# Patient Record
Sex: Female | Born: 1975 | Race: Black or African American | Hispanic: No | Marital: Married | Smoking: Former smoker
Health system: Southern US, Community
[De-identification: ages and names within clinical notes are randomized; demographics above are authoritative.]

## PROBLEM LIST (undated history)

## (undated) DIAGNOSIS — I10 Essential (primary) hypertension: Secondary | ICD-10-CM

## (undated) DIAGNOSIS — E119 Type 2 diabetes mellitus without complications: Secondary | ICD-10-CM

## (undated) HISTORY — PX: DILATION AND CURETTAGE OF UTERUS: SHX78

## (undated) HISTORY — PX: OTHER SURGICAL HISTORY: SHX169

## (undated) HISTORY — PX: TUBAL LIGATION: SHX77

---

## 2011-08-19 ENCOUNTER — Encounter: Payer: Self-pay | Admitting: *Deleted

## 2011-08-19 DIAGNOSIS — R040 Epistaxis: Secondary | ICD-10-CM | POA: Insufficient documentation

## 2011-08-19 DIAGNOSIS — I10 Essential (primary) hypertension: Secondary | ICD-10-CM | POA: Insufficient documentation

## 2011-08-19 NOTE — ED Notes (Signed)
Pt states her nose began bleeding about 10 pm. Headache earlier, but not now.

## 2011-08-20 ENCOUNTER — Emergency Department (HOSPITAL_BASED_OUTPATIENT_CLINIC_OR_DEPARTMENT_OTHER)
Admission: EM | Admit: 2011-08-20 | Discharge: 2011-08-20 | Disposition: A | Discharge status: Home / Self Care | Payer: Self-pay | Attending: Emergency Medicine | Admitting: Emergency Medicine

## 2011-08-20 DIAGNOSIS — R040 Epistaxis: Secondary | ICD-10-CM

## 2011-08-20 DIAGNOSIS — I10 Essential (primary) hypertension: Secondary | ICD-10-CM

## 2011-08-20 HISTORY — DX: Essential (primary) hypertension: I10

## 2011-08-20 MED ORDER — HYDROCHLOROTHIAZIDE 25 MG PO TABS
25.0000 mg | ORAL_TABLET | Freq: Once | ORAL | Status: AC
  Administered 2011-08-20: 25 mg via ORAL
  Filled 2011-08-20: qty 1

## 2011-08-20 MED ORDER — LISINOPRIL 10 MG PO TABS: 10.0000 mg | ORAL_TABLET | Freq: Every day | ORAL | Status: DC

## 2011-08-20 MED ORDER — HYDROCHLOROTHIAZIDE 25 MG PO TABS: 25.0000 mg | ORAL_TABLET | Freq: Every day | ORAL | Status: DC

## 2011-08-20 NOTE — ED Provider Notes (Signed)
History     CSN: [id number] Arrival date & time: 08/20/2011 12:03 AM  Chief Complaint  Patient presents with  . Epistaxis   HPI Comments: History of poorly controlled HTN  Patient is a 35 y.o. female presenting with nosebleeds. The history is provided by the patient. No language interpreter was used.  Epistaxis  This is a new problem. Episode onset: 2 hours prior to arrival. The problem occurs constantly. The problem has been gradually improving. The problem is associated with an unknown factor. The bleeding has been from the left nare. She has tried applying pressure for the symptoms. The treatment provided moderate relief. Her past medical history does not include bleeding disorder, colds, sinus problems, allergies, nose-picking or frequent nosebleeds. Past medical history comments: sleeps with fan.    Past Medical History  Diagnosis Date  . Hypertension     Past Surgical History  Procedure Date  . Ablasion   . Dilation and curettage of uterus   . Cesarean section   . Tubal ligation     History reviewed. No pertinent family history.  History  Substance Use Topics  . Smoking status: Former Games developer  . Smokeless tobacco: Not on file  . Alcohol Use: Yes    OB History    Grav Para Term Preterm Abortions TAB SAB Ect Mult Living                  Review of Systems  Constitutional: Negative for fever, activity change, appetite change and fatigue.  HENT: Positive for nosebleeds. Negative for congestion, sore throat, rhinorrhea, neck pain, neck stiffness and postnasal drip.   Respiratory: Negative for cough, chest tightness and shortness of breath.   Cardiovascular: Negative for chest pain and palpitations.  Gastrointestinal: Negative for nausea, vomiting, abdominal pain and diarrhea.  Genitourinary: Negative for dysuria, urgency, frequency and flank pain.  Skin: Negative for pallor and rash.  Neurological: Negative for dizziness, weakness, light-headedness, numbness and  headaches.  All other systems reviewed and are negative.    Physical Exam  BP 189/111  Pulse 81  Temp(Src) 98.5 F (36.9 C) (Oral)  Resp 18  Ht 5\' 4"  (1.626 m)  Wt 278 lb (126.1 kg)  BMI 47.72 kg/m2  SpO2 99%  Physical Exam  Nursing note and vitals reviewed. Constitutional: She is oriented to person, place, and time. She appears well-developed and well-nourished. No distress.  HENT:  Head: Normocephalic and atraumatic.  Right Ear: External ear normal.  Left Ear: External ear normal.  Nose: Epistaxis (L nare - mild anterior bleed) is observed.  Mouth/Throat: Oropharynx is clear and moist.  Eyes: Conjunctivae and EOM are normal. Pupils are equal, round, and reactive to light.  Neck: Normal range of motion. Neck supple.  Cardiovascular: Normal rate, regular rhythm, normal heart sounds and intact distal pulses.  Exam reveals no gallop and no friction rub.   No murmur heard. Pulmonary/Chest: Effort normal and breath sounds normal. No respiratory distress.  Abdominal: Soft. Bowel sounds are normal. There is no tenderness.  Musculoskeletal: Normal range of motion.  Neurological: She is alert and oriented to person, place, and time.  Skin: Skin is warm and dry.    ED Course  EPISTAXIS MANAGEMENT Performed by: Dayton Bailiff Authorized by: Dayton Bailiff Consent: Verbal consent obtained. Risks and benefits: risks, benefits and alternatives were discussed Consent given by: patient Patient understanding: patient states understanding of the procedure being performed Patient identity confirmed: verbally with patient Time out: Immediately prior to procedure a "time out"  was called to verify the correct patient, procedure, equipment, support staff and site/side marked as required. Preparation: Patient was prepped and draped in the usual sterile fashion. Post-procedure assessment: bleeding stopped Patient tolerance: Patient tolerated the procedure well with no immediate  complications. Comments: Bleeding controlled with afrin and direct pressure. Bleeding did not resume    MDM Hypertension and epistaxis Epistaxis is likely secondary to both under control hypertension and dryness of her nasal mucosa. Bleeding was stopped with application of Afrin direct pressure. Bleeding did not resume. I administered a dose of hydrochlorothiazide and will prescribe her refills of her lisinopril and a prescription for hydrochlorothiazide. I instructed her to followup with her primary care physician for further blood pressure management. She is provided instructions for saline drops, sinus precautions and instructions bleeding redevelops. Instructions were provided for return to emergency department. Patient is safe for discharge. Bleeding was relatively minimal therefore do not feel the patient warranted a CBC at this time. Blood pressure improved in the emergency department and remainder of VS unremarkable      Dayton Bailiff, MD 08/20/11 [phone]

## 2014-02-14 ENCOUNTER — Emergency Department (HOSPITAL_BASED_OUTPATIENT_CLINIC_OR_DEPARTMENT_OTHER)
Admission: EM | Admit: 2014-02-14 | Discharge: 2014-02-14 | Disposition: A | Discharge status: Home / Self Care | Payer: Medicaid Other | Attending: Emergency Medicine | Admitting: Emergency Medicine

## 2014-02-14 ENCOUNTER — Encounter (HOSPITAL_BASED_OUTPATIENT_CLINIC_OR_DEPARTMENT_OTHER): Payer: Self-pay | Admitting: Emergency Medicine

## 2014-02-14 DIAGNOSIS — Y92410 Unspecified street and highway as the place of occurrence of the external cause: Secondary | ICD-10-CM | POA: Insufficient documentation

## 2014-02-14 DIAGNOSIS — Z79899 Other long term (current) drug therapy: Secondary | ICD-10-CM | POA: Insufficient documentation

## 2014-02-14 DIAGNOSIS — S239XXA Sprain of unspecified parts of thorax, initial encounter: Secondary | ICD-10-CM | POA: Insufficient documentation

## 2014-02-14 DIAGNOSIS — Y9389 Activity, other specified: Secondary | ICD-10-CM | POA: Insufficient documentation

## 2014-02-14 DIAGNOSIS — Z87891 Personal history of nicotine dependence: Secondary | ICD-10-CM | POA: Insufficient documentation

## 2014-02-14 DIAGNOSIS — I10 Essential (primary) hypertension: Secondary | ICD-10-CM | POA: Insufficient documentation

## 2014-02-14 MED ORDER — CYCLOBENZAPRINE HCL 10 MG PO TABS: 10.0000 mg | ORAL_TABLET | Freq: Two times a day (BID) | ORAL | Status: DC | PRN
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
## 2014-02-14 NOTE — ED Notes (Signed)
rx x 1 for flexeril

## 2014-02-14 NOTE — Discharge Instructions (Signed)
Continue to take the ibuprofen for pain and inflammation. Take the Flexeril as needed for muscle spasm. Do not take it if you are driving as it will make you sleepy.

## 2014-02-15 NOTE — ED Provider Notes (Signed)
Medical screening examination/treatment/procedure(s) were performed by non-physician practitioner and as supervising physician I was immediately available for consultation/collaboration.   EKG Interpretation None       Kiffany Schelling, MD 02/15/14 2325 

## 2019-03-28 ENCOUNTER — Other Ambulatory Visit: Payer: Self-pay

## 2019-03-28 ENCOUNTER — Encounter (HOSPITAL_BASED_OUTPATIENT_CLINIC_OR_DEPARTMENT_OTHER): Payer: Self-pay | Admitting: *Deleted

## 2019-03-28 ENCOUNTER — Emergency Department (HOSPITAL_BASED_OUTPATIENT_CLINIC_OR_DEPARTMENT_OTHER)
Admission: EM | Admit: 2019-03-28 | Discharge: 2019-03-28 | Disposition: A | Discharge status: Home / Self Care | Payer: BLUE CROSS/BLUE SHIELD | Attending: Emergency Medicine | Admitting: Emergency Medicine

## 2019-03-28 DIAGNOSIS — Z87891 Personal history of nicotine dependence: Secondary | ICD-10-CM | POA: Insufficient documentation

## 2019-03-28 DIAGNOSIS — K625 Hemorrhage of anus and rectum: Secondary | ICD-10-CM | POA: Insufficient documentation

## 2019-03-28 DIAGNOSIS — Z79899 Other long term (current) drug therapy: Secondary | ICD-10-CM | POA: Diagnosis not present

## 2019-03-28 DIAGNOSIS — I10 Essential (primary) hypertension: Secondary | ICD-10-CM | POA: Diagnosis not present

## 2019-03-28 DIAGNOSIS — R739 Hyperglycemia, unspecified: Secondary | ICD-10-CM

## 2019-03-28 LAB — OCCULT BLOOD X 1 CARD TO LAB, STOOL: Fecal Occult Bld: POSITIVE — AB

## 2019-03-28 LAB — CBC WITH DIFFERENTIAL/PLATELET
Abs Immature Granulocytes: 0.03 10*3/uL (ref 0.00–0.07)
Basophils Absolute: 0 10*3/uL (ref 0.0–0.1)
Basophils Relative: 1 %
Eosinophils Absolute: 0.1 10*3/uL (ref 0.0–0.5)
Eosinophils Relative: 1 %
HCT: 42 % (ref 36.0–46.0)
Hemoglobin: 13.3 g/dL (ref 12.0–15.0)
Immature Granulocytes: 0 %
Lymphocytes Relative: 37 %
Lymphs Abs: 3 10*3/uL (ref 0.7–4.0)
MCH: 27.8 pg (ref 26.0–34.0)
MCHC: 31.7 g/dL (ref 30.0–36.0)
MCV: 87.9 fL (ref 80.0–100.0)
Monocytes Absolute: 0.9 10*3/uL (ref 0.1–1.0)
Monocytes Relative: 11 %
Neutro Abs: 4.1 10*3/uL (ref 1.7–7.7)
Neutrophils Relative %: 50 %
Platelets: 238 10*3/uL (ref 150–400)
RBC: 4.78 MIL/uL (ref 3.87–5.11)
RDW: 13.1 % (ref 11.5–15.5)
WBC: 8.1 10*3/uL (ref 4.0–10.5)
nRBC: 0 % (ref 0.0–0.2)

## 2019-03-28 LAB — COMPREHENSIVE METABOLIC PANEL
ALT: 23 U/L (ref 0–44)
AST: 24 U/L (ref 15–41)
Albumin: 3.7 g/dL (ref 3.5–5.0)
Alkaline Phosphatase: 85 U/L (ref 38–126)
Anion gap: 7 (ref 5–15)
BUN: 9 mg/dL (ref 6–20)
CO2: 25 mmol/L (ref 22–32)
Calcium: 8.9 mg/dL (ref 8.9–10.3)
Chloride: 100 mmol/L (ref 98–111)
Creatinine, Ser: 1.05 mg/dL — ABNORMAL HIGH (ref 0.44–1.00)
GFR calc Af Amer: >60 mL/min (ref >60)
GFR calc non Af Amer: >60 mL/min (ref >60)
Glucose, Bld: 267 mg/dL — ABNORMAL HIGH (ref 70–99)
Potassium: 3.7 mmol/L (ref 3.5–5.1)
Sodium: 132 mmol/L — ABNORMAL LOW (ref 135–145)
Total Bilirubin: 0.5 mg/dL (ref 0.3–1.2)
Total Protein: 8 g/dL (ref 6.5–8.1)

## 2019-03-28 MED ORDER — HYDROCORTISONE (PERIANAL) 2.5 % EX CREA: 1.0000 "application " | TOPICAL_CREAM | Freq: Two times a day (BID) | CUTANEOUS | 0 refills | Status: DC

## 2019-03-28 NOTE — ED Provider Notes (Signed)
[facility] EMERGENCY DEPARTMENT Provider Note   CSN: [id number] Arrival date & time: 03/28/19  1827    History   Chief Complaint Chief Complaint  Patient presents with  . Rectal Bleeding    HPI Jessica Washington is a 43 y.o. female.     The history is provided by the patient and medical records. No language interpreter was used.  Rectal Bleeding  Associated symptoms: no abdominal pain and no vomiting    Jessica Washington is a 43 y.o. female  with a PMH of HTN who presents to the Emergency Department complaining of rectal bleeding today.  Patient states that she has never had this before.  She was in her normal state of health yesterday.  She states that she had a bowel movement this morning which was normal.  She then had another bowel movement this afternoon and reports about a handful of bright red blood was in the toilet.  She noticed some bleeding when wiping as well.  Denies any vaginal bleeding and is not on her menstrual cycle.  She states that she has not had any diarrhea or constipation in the last week, but was a little constipated a week or 2 ago.  She denies any abdominal pain, nausea or vomiting.  She has no history of this and has not ever had a colonoscopy.  She denies any rectal pain.  No lightheadedness, weakness, dizziness.  Past Medical History:  Diagnosis Date  . Hypertension     There are no active problems to display for this patient.   Past Surgical History:  Procedure Laterality Date  . Ablasion    . CESAREAN SECTION    . DILATION AND CURETTAGE OF UTERUS    . TUBAL LIGATION       OB History   No obstetric history on file.      Home Medications    Prior to Admission medications   Medication Sig Start Date End Date Taking? Authorizing Provider  amLODipine-benazepril (LOTREL) 5-10 MG per capsule Take 1 capsule by mouth daily.   Yes [provider]  atorvastatin (LIPITOR) 20 MG tablet Take 20 mg by mouth daily.   Yes [provider]  hydrochlorothiazide 25 MG tablet Take 1 tablet (25 mg total) by mouth daily. 08/20/11 03/28/19 Yes Dayton Bailiff, MD  lisinopril (PRINIVIL,ZESTRIL) 10 MG tablet Take 40 mg by mouth daily.    Yes [provider]  omeprazole (PRILOSEC) 40 MG capsule Take 40 mg by mouth daily.   Yes [provider]  cyclobenzaprine (FLEXERIL) 10 MG tablet Take 1 tablet (10 mg total) by mouth 2 (two) times daily as needed for muscle spasms. 02/14/14   Janne Napoleon, NP  hydrocortisone (ANUSOL-HC) 2.5 % rectal cream Place 1 application rectally 2 (two) times daily. 03/28/19   Ward, Chase Picket, PA-C  lisinopril (PRINIVIL,ZESTRIL) 10 MG tablet Take 1 tablet (10 mg total) by mouth daily. 08/20/11 08/19/12  Dayton Bailiff, MD    Family History No family history on file.  Social History Social History   Tobacco Use  . Smoking status: Former Games developer  . Smokeless tobacco: Never Used  Substance Use Topics  . Alcohol use: Yes  . Drug use: No     Allergies   Patient has no known allergies.   Review of Systems Review of Systems  Gastrointestinal: Positive for anal bleeding, blood in stool and hematochezia. Negative for abdominal pain, constipation, diarrhea, nausea and vomiting.  All other systems reviewed and are negative.  Physical Exam Updated Vital Signs BP (!) 167/98   Pulse 85   Temp 97.9 F (36.6 C) (Oral)   Resp 20   Ht 5\' 4"  (1.626 m)   Wt 109.3 kg   SpO2 99%   BMI 41.37 kg/m   Physical Exam Vitals signs and nursing note reviewed.  Constitutional:      General: She is not in acute distress.    Appearance: She is well-developed.  HENT:     Head: Normocephalic and atraumatic.  Neck:     Musculoskeletal: Neck supple.  Cardiovascular:     Rate and Rhythm: Normal rate and regular rhythm.     Heart sounds: Normal heart sounds. No murmur.  Pulmonary:     Effort: Pulmonary effort is normal. No respiratory distress.     Breath sounds: Normal breath sounds.   Abdominal:     General: There is no distension.     Palpations: Abdomen is soft.     Comments: No abdominal tenderness.   Genitourinary:    Comments: Nontender external hemorrhoid with good fleshy coloring.  No overt bleeding noted.  Light brown stool. Skin:    General: Skin is warm and dry.  Neurological:     Mental Status: She is alert and oriented to person, place, and time.      ED Treatments / Results  Labs (all labs ordered are listed, but only abnormal results are displayed) Labs Reviewed  COMPREHENSIVE METABOLIC PANEL - Abnormal; Notable for the following components:      Result Value   Sodium 132 (*)    Glucose, Bld 267 (*)    Creatinine, Ser 1.05 (*)    All other components within normal limits  OCCULT BLOOD X 1 CARD TO LAB, STOOL - Abnormal; Notable for the following components:   Fecal Occult Bld POSITIVE (*)    All other components within normal limits  CBC WITH DIFFERENTIAL/PLATELET    EKG None  Radiology No results found.  Procedures Procedures (including critical care time)  Medications Ordered in ED Medications - No data to display   Initial Impression / Assessment and Plan / ED Course  I have reviewed the triage vital signs and the nursing notes.  Pertinent labs & imaging results that were available during my care of the patient were reviewed by me and considered in my medical decision making (see chart for details).       Jessica Washington is a 43 y.o. female who presents to ED for bright red blood per rectum earlier today. VSS.  GU exam with external hemorrhoid which is nontender.  Light brown stool.  No overt bleeding noted.  No abdominal tenderness.  Hemoglobin normal.  Bleeding quite possibly could be due to the hemorrhoid.  Will provide Anusol cream and have her follow-up with her primary care doctor.  Glucose was noted to be 267.  She does report eating a 12 count took a negative meal with [place] fries and a pink lemonade from [employer] just  prior to arrival.  Still feels little higher than I would expect it to be.  Discussed dietary changes for the weekend.  She has a primary care doctor whom she reports she can follow-up with quickly.  Encouraged her to call first thing Monday morning to schedule follow-up appointment for further discussion of her elevated glucose levels and further work-up for possible diabetes.  Reasons to return to the emergency department were discussed with the patient at length and all questions were answered.  Patient  discussed with Dr. Silverio LayYao who agrees with treatment plan.    Final Clinical Impressions(s) / ED Diagnoses   Final diagnoses:  Rectal bleeding  Hyperglycemia    ED Discharge Orders         Ordered    hydrocortisone (ANUSOL-HC) 2.5 % rectal cream  2 times daily     03/28/19 2011           Ward, Chase PicketJaime Pilcher, PA-C 03/28/19 2024    Charlynne PanderYao, David Hsienta, MD 03/28/19 2113

## 2019-03-28 NOTE — ED Triage Notes (Signed)
Red rectal bleeding today. No pain. States she only sees the blood when she has a BM.

## 2019-03-28 NOTE — Discharge Instructions (Addendum)
It was my pleasure taking care of you today!   Apply cream to rectum area once or twice a day for the next 3-5 days.   As we discussed, watch what you eat closely.  Avoid sugary foods and carbs (breads, pastas, potatoes, etc.).  Call your primary care doctor Monday morning.  Please let them know that your blood sugar was elevated today.  Your glucose was 267.  Let them know that we would like you to be seen to have further lab work to evaluate for diabetes.  You should also discuss your bleeding and let them know how this is doing at that appointment.  Return to ER for new or worsening symptoms, any additional concerns.

## 2020-09-29 ENCOUNTER — Other Ambulatory Visit: Payer: Self-pay

## 2020-09-29 ENCOUNTER — Emergency Department (HOSPITAL_BASED_OUTPATIENT_CLINIC_OR_DEPARTMENT_OTHER): Payer: BLUE CROSS/BLUE SHIELD

## 2020-09-29 ENCOUNTER — Encounter (HOSPITAL_BASED_OUTPATIENT_CLINIC_OR_DEPARTMENT_OTHER): Payer: Self-pay | Admitting: *Deleted

## 2020-09-29 ENCOUNTER — Inpatient Hospital Stay (HOSPITAL_BASED_OUTPATIENT_CLINIC_OR_DEPARTMENT_OTHER)
Admission: EM | Admit: 2020-09-29 | Discharge: 2020-10-03 | DRG: 177 | Disposition: A | Discharge status: Home / Self Care | Payer: BLUE CROSS/BLUE SHIELD | Attending: Internal Medicine | Admitting: Internal Medicine

## 2020-09-29 DIAGNOSIS — J9600 Acute respiratory failure, unspecified whether with hypoxia or hypercapnia: Secondary | ICD-10-CM | POA: Diagnosis not present

## 2020-09-29 DIAGNOSIS — E1165 Type 2 diabetes mellitus with hyperglycemia: Secondary | ICD-10-CM | POA: Diagnosis present

## 2020-09-29 DIAGNOSIS — Z6841 Body Mass Index (BMI) 40.0 and over, adult: Secondary | ICD-10-CM

## 2020-09-29 DIAGNOSIS — Z8249 Family history of ischemic heart disease and other diseases of the circulatory system: Secondary | ICD-10-CM

## 2020-09-29 DIAGNOSIS — J069 Acute upper respiratory infection, unspecified: Secondary | ICD-10-CM | POA: Insufficient documentation

## 2020-09-29 DIAGNOSIS — J1282 Pneumonia due to coronavirus disease 2019: Secondary | ICD-10-CM | POA: Diagnosis present

## 2020-09-29 DIAGNOSIS — I10 Essential (primary) hypertension: Secondary | ICD-10-CM | POA: Diagnosis present

## 2020-09-29 DIAGNOSIS — Z888 Allergy status to other drugs, medicaments and biological substances status: Secondary | ICD-10-CM | POA: Diagnosis not present

## 2020-09-29 DIAGNOSIS — J9601 Acute respiratory failure with hypoxia: Secondary | ICD-10-CM | POA: Diagnosis present

## 2020-09-29 DIAGNOSIS — T380X5A Adverse effect of glucocorticoids and synthetic analogues, initial encounter: Secondary | ICD-10-CM | POA: Diagnosis present

## 2020-09-29 DIAGNOSIS — Z79899 Other long term (current) drug therapy: Secondary | ICD-10-CM | POA: Diagnosis not present

## 2020-09-29 DIAGNOSIS — E871 Hypo-osmolality and hyponatremia: Secondary | ICD-10-CM | POA: Diagnosis present

## 2020-09-29 DIAGNOSIS — U071 COVID-19: Secondary | ICD-10-CM | POA: Diagnosis present

## 2020-09-29 DIAGNOSIS — Y92239 Unspecified place in hospital as the place of occurrence of the external cause: Secondary | ICD-10-CM | POA: Diagnosis present

## 2020-09-29 DIAGNOSIS — E785 Hyperlipidemia, unspecified: Secondary | ICD-10-CM | POA: Diagnosis present

## 2020-09-29 DIAGNOSIS — Z7984 Long term (current) use of oral hypoglycemic drugs: Secondary | ICD-10-CM | POA: Diagnosis not present

## 2020-09-29 DIAGNOSIS — Z87891 Personal history of nicotine dependence: Secondary | ICD-10-CM | POA: Diagnosis not present

## 2020-09-29 HISTORY — DX: Type 2 diabetes mellitus without complications: E11.9

## 2020-09-29 LAB — LACTIC ACID, PLASMA: Lactic Acid, Venous: 0.9 mmol/L (ref 0.5–1.9)

## 2020-09-29 LAB — CBC WITH DIFFERENTIAL/PLATELET
Abs Immature Granulocytes: 0.02 10*3/uL (ref 0.00–0.07)
Basophils Absolute: 0 10*3/uL (ref 0.0–0.1)
Basophils Relative: 0 %
Eosinophils Absolute: 0 10*3/uL (ref 0.0–0.5)
Eosinophils Relative: 0 %
HCT: 36.5 % (ref 36.0–46.0)
Hemoglobin: 11.9 g/dL — ABNORMAL LOW (ref 12.0–15.0)
Immature Granulocytes: 1 %
Lymphocytes Relative: 39 %
Lymphs Abs: 1.6 10*3/uL (ref 0.7–4.0)
MCH: 27.5 pg (ref 26.0–34.0)
MCHC: 32.6 g/dL (ref 30.0–36.0)
MCV: 84.3 fL (ref 80.0–100.0)
Monocytes Absolute: 0.2 10*3/uL (ref 0.1–1.0)
Monocytes Relative: 5 %
Neutro Abs: 2.3 10*3/uL (ref 1.7–7.7)
Neutrophils Relative %: 55 %
Platelets: 168 10*3/uL (ref 150–400)
RBC: 4.33 MIL/uL (ref 3.87–5.11)
RDW: 13.3 % (ref 11.5–15.5)
WBC: 4.1 10*3/uL (ref 4.0–10.5)
nRBC: 0 % (ref 0.0–0.2)

## 2020-09-29 LAB — RESPIRATORY PANEL BY RT PCR (FLU A&B, COVID)
Influenza A by PCR: NEGATIVE
Influenza B by PCR: NEGATIVE
SARS Coronavirus 2 by RT PCR: POSITIVE — AB

## 2020-09-29 LAB — TRIGLYCERIDES: Triglycerides: 146 mg/dL (ref <150)

## 2020-09-29 LAB — COMPREHENSIVE METABOLIC PANEL
ALT: 33 U/L (ref 0–44)
AST: 60 U/L — ABNORMAL HIGH (ref 15–41)
Albumin: 3.3 g/dL — ABNORMAL LOW (ref 3.5–5.0)
Alkaline Phosphatase: 63 U/L (ref 38–126)
Anion gap: 11 (ref 5–15)
BUN: 11 mg/dL (ref 6–20)
CO2: 22 mmol/L (ref 22–32)
Calcium: 8.3 mg/dL — ABNORMAL LOW (ref 8.9–10.3)
Chloride: 100 mmol/L (ref 98–111)
Creatinine, Ser: 1.02 mg/dL — ABNORMAL HIGH (ref 0.44–1.00)
GFR, Estimated: >60 mL/min (ref >60)
Glucose, Bld: 149 mg/dL — ABNORMAL HIGH (ref 70–99)
Potassium: 3.8 mmol/L (ref 3.5–5.1)
Sodium: 133 mmol/L — ABNORMAL LOW (ref 135–145)
Total Bilirubin: 0.2 mg/dL — ABNORMAL LOW (ref 0.3–1.2)
Total Protein: 7.1 g/dL (ref 6.5–8.1)

## 2020-09-29 LAB — C-REACTIVE PROTEIN: CRP: 6.9 mg/dL — ABNORMAL HIGH (ref <1.0)

## 2020-09-29 LAB — FIBRINOGEN: Fibrinogen: 527 mg/dL — ABNORMAL HIGH (ref 210–475)

## 2020-09-29 LAB — D-DIMER, QUANTITATIVE: D-Dimer, Quant: 1.98 ug/mL-FEU — ABNORMAL HIGH (ref 0.00–0.50)

## 2020-09-29 LAB — FERRITIN: Ferritin: 335 ng/mL — ABNORMAL HIGH (ref 11–307)

## 2020-09-29 LAB — PROCALCITONIN: Procalcitonin: 0.19 ng/mL

## 2020-09-29 LAB — LACTATE DEHYDROGENASE: LDH: 435 U/L — ABNORMAL HIGH (ref 98–192)

## 2020-09-29 MED ORDER — HYDROCHLOROTHIAZIDE 25 MG PO TABS
50.0000 mg | ORAL_TABLET | Freq: Every day | ORAL | Status: DC
  Administered 2020-09-30 – 2020-10-03 (×4): 50 mg via ORAL
  Filled 2020-09-29 (×4): qty 2

## 2020-09-29 MED ORDER — ZINC SULFATE 220 (50 ZN) MG PO CAPS
220.0000 mg | ORAL_CAPSULE | Freq: Every day | ORAL | Status: DC
  Administered 2020-09-30 – 2020-10-03 (×4): 220 mg via ORAL
  Filled 2020-09-29 (×4): qty 1

## 2020-09-29 MED ORDER — AMLODIPINE BESYLATE 10 MG PO TABS
10.0000 mg | ORAL_TABLET | Freq: Every day | ORAL | Status: DC
  Administered 2020-09-30 – 2020-10-03 (×4): 10 mg via ORAL
  Filled 2020-09-29 (×4): qty 1

## 2020-09-29 MED ORDER — ACETAMINOPHEN 650 MG RE SUPP: 650.0000 mg | Freq: Four times a day (QID) | RECTAL | Status: DC | PRN

## 2020-09-29 MED ORDER — METHYLPREDNISOLONE SODIUM SUCC 125 MG IJ SOLR
0.5000 mg/kg | Freq: Two times a day (BID) | INTRAMUSCULAR | Status: DC
  Administered 2020-09-30 (×2): 55 mg via INTRAVENOUS
  Filled 2020-09-29 (×2): qty 2

## 2020-09-29 MED ORDER — ENOXAPARIN SODIUM 40 MG/0.4ML ~~LOC~~ SOLN
40.0000 mg | SUBCUTANEOUS | Status: DC
  Administered 2020-09-30: 40 mg via SUBCUTANEOUS
  Filled 2020-09-29: qty 0.4

## 2020-09-29 MED ORDER — ASCORBIC ACID 500 MG PO TABS
500.0000 mg | ORAL_TABLET | Freq: Every day | ORAL | Status: DC
  Administered 2020-09-30 – 2020-10-03 (×4): 500 mg via ORAL
  Filled 2020-09-29 (×4): qty 1

## 2020-09-29 MED ORDER — LOSARTAN POTASSIUM 50 MG PO TABS
100.0000 mg | ORAL_TABLET | Freq: Every day | ORAL | Status: DC
  Administered 2020-09-30 – 2020-10-03 (×4): 100 mg via ORAL
  Filled 2020-09-29 (×4): qty 2

## 2020-09-29 MED ORDER — GUAIFENESIN-DM 100-10 MG/5ML PO SYRP
10.0000 mL | ORAL_SOLUTION | ORAL | Status: DC | PRN
  Administered 2020-09-30 (×2): 10 mL via ORAL
  Filled 2020-09-29 (×2): qty 10

## 2020-09-29 MED ORDER — SODIUM CHLORIDE 0.9 % IV SOLN
100.0000 mg | Freq: Every day | INTRAVENOUS | Status: AC
  Administered 2020-09-30 – 2020-10-03 (×4): 100 mg via INTRAVENOUS
  Filled 2020-09-29 (×4): qty 20

## 2020-09-29 MED ORDER — ROSUVASTATIN CALCIUM 20 MG PO TABS
40.0000 mg | ORAL_TABLET | Freq: Every day | ORAL | Status: DC
  Administered 2020-09-30 – 2020-10-03 (×4): 40 mg via ORAL
  Filled 2020-09-29 (×4): qty 2

## 2020-09-29 MED ORDER — IOHEXOL 350 MG/ML SOLN
100.0000 mL | Freq: Once | INTRAVENOUS | Status: AC | PRN
  Administered 2020-09-29: 100 mL via INTRAVENOUS

## 2020-09-29 MED ORDER — SODIUM CHLORIDE 0.9 % IV SOLN
100.0000 mg | Freq: Every day | INTRAVENOUS | Status: AC
  Administered 2020-09-29: 100 mg via INTRAVENOUS
  Filled 2020-09-29: qty 20

## 2020-09-29 MED ORDER — INSULIN ASPART 100 UNIT/ML ~~LOC~~ SOLN: 0.0000 [IU] | Freq: Three times a day (TID) | SUBCUTANEOUS | Status: DC

## 2020-09-29 MED ORDER — INSULIN GLARGINE 100 UNIT/ML ~~LOC~~ SOLN
15.0000 [IU] | Freq: Every day | SUBCUTANEOUS | Status: DC
  Administered 2020-09-30 (×2): 15 [IU] via SUBCUTANEOUS
  Filled 2020-09-29 (×2): qty 0.15

## 2020-09-29 MED ORDER — ACETAMINOPHEN 325 MG PO TABS: 650.0000 mg | ORAL_TABLET | Freq: Four times a day (QID) | ORAL | Status: DC | PRN

## 2020-09-29 MED ORDER — DEXAMETHASONE SODIUM PHOSPHATE 10 MG/ML IJ SOLN
10.0000 mg | Freq: Once | INTRAMUSCULAR | Status: AC
  Administered 2020-09-29: 10 mg via INTRAVENOUS
  Filled 2020-09-29: qty 1

## 2020-09-29 MED ORDER — HYDROCOD POLST-CPM POLST ER 10-8 MG/5ML PO SUER
5.0000 mL | Freq: Two times a day (BID) | ORAL | Status: DC | PRN
  Administered 2020-09-30: 5 mL via ORAL
  Filled 2020-09-29: qty 5

## 2020-09-29 MED ORDER — PREDNISONE 20 MG PO TABS: 50.0000 mg | ORAL_TABLET | Freq: Every day | ORAL | Status: DC

## 2020-09-29 MED ORDER — EZETIMIBE 10 MG PO TABS
10.0000 mg | ORAL_TABLET | Freq: Every day | ORAL | Status: DC
  Administered 2020-09-30 – 2020-10-03 (×4): 10 mg via ORAL
  Filled 2020-09-29 (×4): qty 1

## 2020-09-29 MED ORDER — ACETAMINOPHEN 325 MG PO TABS
650.0000 mg | ORAL_TABLET | Freq: Once | ORAL | Status: AC
  Administered 2020-09-29: 650 mg via ORAL
  Filled 2020-09-29: qty 2

## 2020-09-29 MED ORDER — PANTOPRAZOLE SODIUM 40 MG PO TBEC
40.0000 mg | DELAYED_RELEASE_TABLET | Freq: Every day | ORAL | Status: DC
  Administered 2020-09-30 – 2020-10-03 (×4): 40 mg via ORAL
  Filled 2020-09-29 (×4): qty 1

## 2020-09-29 MED ORDER — SODIUM CHLORIDE 0.9 % IV SOLN
INTRAVENOUS | Status: DC | PRN
  Administered 2020-09-29: 500 mL via INTRAVENOUS

## 2020-09-29 NOTE — Treatment Plan (Signed)
44 yo with hx htn with URI symptoms, COVID positive.  She's unvaccinated.  Worsening SOB over past 2 days.  Currently requiring 2 L to maintain O2 sats >88%.  Vitals notable for fever, tachypnea, hypoxia.  Labs notable for elevated d dimer and mildly elevated AST.  Hyperglycemia.  Requested CT PE protocol.  Will admit to tele bed.  Steroids, remdesivir ordered per EDP.

## 2020-09-29 NOTE — ED Triage Notes (Signed)
C/o URI symptoms x 6 days

## 2020-09-29 NOTE — ED Notes (Signed)
Patient transported to CT 

## 2020-09-29 NOTE — ED Provider Notes (Signed)
[facility] EMERGENCY DEPARTMENT Provider Note   CSN: [id number] Arrival date & time: 09/29/20  1409     History Chief Complaint  Patient presents with  . Cough    Jessica Washington is a 44 y.o. female.  HPI Patient is a 44 year old female with a history of diabetes and hypertension on medications for both of these presented today with 6 days of "URI-like symptoms ".  She states that these consist of cough, congestion, shortness of breath.  She states this is significantly worsened over the past 2 days.  She states that she did not know she had a fever until she was told that she had 1.  She denies any lightheadedness or dizziness.  She denies any chest pain.  She denies any unilateral leg swelling or calf tenderness.  She states she has been able to do her activities of daily living but been so fatigued that she feels like she has only been making trips to do basic tasks.  She denies any heart palpitations.  She is unvaccinated for COVID-19.  She states her husband has had similar symptoms.  Denies any nausea vomiting diarrhea or abdominal pain.  She is no aggravating or mitigating factors of her symptoms.  She has not used any significant medications prior to arrival other than her normal prescribed medications.     Past Medical History:  Diagnosis Date  . Hypertension     There are no problems to display for this patient.   Past Surgical History:  Procedure Laterality Date  . Ablasion    . CESAREAN SECTION    . DILATION AND CURETTAGE OF UTERUS    . TUBAL LIGATION       OB History   No obstetric history on file.     No family history on file.  Social History   Tobacco Use  . Smoking status: Former Games developer  . Smokeless tobacco: Never Used  Substance Use Topics  . Alcohol use: Yes  . Drug use: No    Home Medications Prior to Admission medications   Medication Sig Start Date End Date Taking? Authorizing Provider  amLODipine-benazepril (LOTREL) 5-10 MG  per capsule Take 1 capsule by mouth daily.    [provider]  atorvastatin (LIPITOR) 20 MG tablet Take 20 mg by mouth daily.    [provider]  cyclobenzaprine (FLEXERIL) 10 MG tablet Take 1 tablet (10 mg total) by mouth 2 (two) times daily as needed for muscle spasms. 02/14/14   Janne Napoleon, NP  hydrochlorothiazide 25 MG tablet Take 1 tablet (25 mg total) by mouth daily. 08/20/11 03/28/19  Dayton Bailiff, MD  hydrocortisone (ANUSOL-HC) 2.5 % rectal cream Place 1 application rectally 2 (two) times daily. 03/28/19   Ward, Chase Picket, PA-C  lisinopril (PRINIVIL,ZESTRIL) 10 MG tablet Take 40 mg by mouth daily.     [provider]  lisinopril (PRINIVIL,ZESTRIL) 10 MG tablet Take 1 tablet (10 mg total) by mouth daily. 08/20/11 08/19/12  Dayton Bailiff, MD  omeprazole (PRILOSEC) 40 MG capsule Take 40 mg by mouth daily.    [provider]    Allergies    Patient has no known allergies.  Review of Systems   Review of Systems  Constitutional: Positive for chills, fatigue and fever.  HENT: Positive for congestion and rhinorrhea.   Respiratory: Positive for cough and shortness of breath.   Cardiovascular: Negative for chest pain and leg swelling.  Gastrointestinal: Positive for nausea. Negative for abdominal pain, diarrhea and vomiting.  Genitourinary: Negative for dysuria.  Musculoskeletal: Positive for myalgias.  Skin: Negative for rash.  Neurological: Negative for dizziness and headaches.  Psychiatric/Behavioral: Negative for agitation.    Physical Exam Updated Vital Signs BP 137/80   Pulse 88   Temp 99 F (37.2 C) (Oral)   Resp 17   Ht 5\' 4"  (1.626 m)   Wt 110.2 kg   SpO2 94%   BMI 41.71 kg/m   Physical Exam Vitals and nursing note reviewed.  Constitutional:      General: She is not in acute distress.    Comments: Pleasant well-appearing 44 year old.  In no acute distress.  Sitting comfortably in bed.  Able answer questions appropriately follow  commands. No increased work of breathing. Speaking in full sentences.  HENT:     Head: Normocephalic and atraumatic.     Nose: Nose normal.     Mouth/Throat:     Mouth: Mucous membranes are moist.  Eyes:     General: No scleral icterus. Cardiovascular:     Rate and Rhythm: Normal rate and regular rhythm.     Pulses: Normal pulses.     Heart sounds: Normal heart sounds.  Pulmonary:     Effort: Pulmonary effort is normal. No respiratory distress.     Breath sounds: Rales present. No wheezing.  Chest:     Chest wall: No tenderness.  Abdominal:     Palpations: Abdomen is soft.     Tenderness: There is no abdominal tenderness. There is no guarding or rebound.  Musculoskeletal:     Cervical back: Normal range of motion.     Right lower leg: No edema.     Left lower leg: No edema.  Skin:    General: Skin is warm and dry.     Capillary Refill: Capillary refill takes less than 2 seconds.  Neurological:     Mental Status: She is alert. Mental status is at baseline.  Psychiatric:        Mood and Affect: Mood normal.        Behavior: Behavior normal.     ED Results / Procedures / Treatments   Labs (all labs ordered are listed, but only abnormal results are displayed) Labs Reviewed  RESPIRATORY PANEL BY RT PCR (FLU A&B, COVID) - Abnormal; Notable for the following components:      Result Value   SARS Coronavirus 2 by RT PCR POSITIVE (*)    All other components within normal limits  CBC WITH DIFFERENTIAL/PLATELET - Abnormal; Notable for the following components:   Hemoglobin 11.9 (*)    All other components within normal limits  COMPREHENSIVE METABOLIC PANEL - Abnormal; Notable for the following components:   Sodium 133 (*)    Glucose, Bld 149 (*)    Creatinine, Ser 1.02 (*)    Calcium 8.3 (*)    Albumin 3.3 (*)    AST 60 (*)    Total Bilirubin 0.2 (*)    All other components within normal limits  CULTURE, BLOOD (ROUTINE X 2)  CULTURE, BLOOD (ROUTINE X 2)  LACTIC ACID,  PLASMA  LACTIC ACID, PLASMA  LACTATE DEHYDROGENASE  FERRITIN  TRIGLYCERIDES  C-REACTIVE PROTEIN  PREGNANCY, URINE  D-DIMER, QUANTITATIVE (NOT AT [facility])  FIBRINOGEN  PROCALCITONIN    EKG None  Radiology DG Chest Port 1 View  Result Date: 09/29/2020 CLINICAL DATA:  Shortness of breath, suspect COVID. EXAM: PORTABLE CHEST 1 VIEW COMPARISON:  None. FINDINGS: Cardiac silhouette is accentuated by low lung volumes and portable technique. Patchy  airspace opacities in bilateral lung bases and the left midlung. No visible pleural effusions or pneumothorax. Borderline enlargement of cardiac silhouette which is accentuated by low lung volumes and portable technique. The visualized skeletal structures are unremarkable. IMPRESSION: Patchy airspace opacities in bilateral lung bases and the left midlung, compatible with multifocal pneumonia. Correlate with COVID testing. Electronically Signed   By: Feliberto Harts MD   On: 09/29/2020 15:25    Procedures .Critical Care Performed by: Gailen Shelter, PA Authorized by: Gailen Shelter, PA   Critical care provider statement:    Critical care time (minutes):  45   Critical care was necessary to treat or prevent imminent or life-threatening deterioration of the following conditions:  Respiratory failure   Critical care was time spent personally by me on the following activities:  Discussions with consultants, evaluation of patient's response to treatment, examination of patient, ordering and performing treatments and interventions, ordering and review of laboratory studies, ordering and review of radiographic studies, pulse oximetry, re-evaluation of patient's condition, obtaining history from patient or surrogate and review of old charts Comments:     Hypoxic respiratory failure secondary to COVID-19   (including critical care time)  Medications Ordered in ED Medications  dexamethasone (DECADRON) injection 10 mg (has no administration in time  range)  acetaminophen (TYLENOL) tablet 650 mg (650 mg Oral Given 09/29/20 1428)    ED Course  I have reviewed the triage vital signs and the nursing notes.  Pertinent labs & imaging results that were available during my care of the patient were reviewed by me and considered in my medical decision making (see chart for details).    MDM Rules/Calculators/A&P                          Patient is a 44 year old female with past medical history as above presented for 6 days of symptoms consistent with COVID-19. She has not been vaccinated nor she has been tested yet. Physical exam is notable for crackles and she is notably hypoxic desaturating to 87% when I personally ambulated the patient. She is mildly tachypneic but not severely uncomfortable.  Suspect Covid related hypoxia low suspicion for pulmonary embolism given she has few risk factors apart from her obesity.  CMP with very mild hyponatremia kidney function very mildly elevated at 1.02 however no comparison this may be baseline. CBC without leukocytosis or significant/impressive anemia. Lactic within normal limits.  Covid test is positive. Negative for flu. Orders placed for remdesivir and Decadron  Chest x-ray consistent with COVID-19. Agree of radiology read.  Discussed with Dr. Lowell Guitar of internal medicine who will place admission orders for patient for telemetry. He is requesting CT PE study before admission. Will place order for this. Patient updated on plan. Remdesivir and Decadron provided to patient her fever has significantly improved since Tylenol.  Blood cultures, procalcitonin, fibrinogen, CRP, triglycerides, ferritin all pending.  Patient updated on plan. Admitted to hospital service. Vital signs within normal limits apart from mild tachypnea and hypoxia which corrects quickly with 2 L nasal cannula. Discussed patient with oncoming team so they are aware of care plan.  Final Clinical Impression(s) / ED Diagnoses Final  diagnoses:  COVID-19    Rx / DC Orders ED Discharge Orders    None       Gailen Shelter, [place] 09/29/20 1736    Sabino Donovan, MD 09/29/20 1944

## 2020-09-29 NOTE — H&P (Signed)
History and Physical    Jessica Baaseresa Barrick ONG:[id number] DOB: 04/14/1976 DOA: 09/29/2020  PCP: Ranae PilaEe, Lena, FNP  Patient coming from: Home.  Chief Complaint: Shortness of breath.  HPI: Jessica Washington is a 44 y.o. female with history of hypertension and diabetes mellitus has been experiencing upper respiratory tract symptoms for the last 1 week with fever chills and myalgia.  Had gone to her endocrinologist 2 days ago and was prescribed Z-Pak despite taking which patient was getting more short of breath decided to come to the ER.  Denies any chest pain nausea vomiting or diarrhea.  ED Course: In the ER patient is requiring 2 L oxygen to maintain sats.  CT angiogram shows bilateral infiltrates with Covid test being positive.  Labs are significant for hemoglobin of 11.9 creatinine 1.02 normal GFR glucose 149 procalcitonin 1.19.  CRP was 6.9.  Patient is started on IV Decadron and remdesivir admitted for further management.  Review of Systems: As per HPI, rest all negative.   Past Medical History:  Diagnosis Date  . Diabetes mellitus without complication (HCC)   . Hypertension     Past Surgical History:  Procedure Laterality Date  . Ablasion    . CESAREAN SECTION    . DILATION AND CURETTAGE OF UTERUS    . TUBAL LIGATION       reports that she has quit smoking. She has never used smokeless tobacco. She reports current alcohol use. She reports that she does not use drugs.  Allergies  Allergen Reactions  . Lisinopril Swelling    Family History  Problem Relation Age of Onset  . Hypertension Mother   . Hypertension Father     Prior to Admission medications   Medication Sig Start Date End Date Taking? Authorizing Provider  amLODipine (NORVASC) 10 MG tablet Take 10 mg by mouth daily.   Yes [provider]  azithromycin (ZITHROMAX) 250 MG tablet Take 250 mg by mouth See admin instructions. Zpack- Start date : 09/28/20 09/28/20  Yes [provider]  benzonatate  (TESSALON) 100 MG capsule Take 100 mg by mouth 3 (three) times daily as needed for cough.  09/28/20 10/05/20 Yes [provider]  ezetimibe (ZETIA) 10 MG tablet Take 10 mg by mouth daily. 09/16/20  Yes [provider]  hydrochlorothiazide (HYDRODIURIL) 50 MG tablet Take 50 mg by mouth daily.   Yes [provider]  losartan (COZAAR) 100 MG tablet Take 100 mg by mouth daily.   Yes [provider]  metFORMIN (GLUCOPHAGE) 1000 MG tablet Take 1,000 mg by mouth 2 (two) times daily with a meal.  09/28/20  Yes [provider]  omeprazole (PRILOSEC) 40 MG capsule Take 40 mg by mouth daily as needed (acid reflux).    Yes [provider]  rosuvastatin (CRESTOR) 40 MG tablet Take 40 mg by mouth daily.   Yes [provider]  Semaglutide 14 MG TABS Take 14 mg by mouth daily.  09/28/20  Yes [provider]  cyclobenzaprine (FLEXERIL) 10 MG tablet Take 1 tablet (10 mg total) by mouth 2 (two) times daily as needed for muscle spasms. Patient not taking: Reported on 09/29/2020 02/14/14   Janne NapoleonNeese, Hope M, NP  hydrochlorothiazide 25 MG tablet Take 1 tablet (25 mg total) by mouth daily. Patient not taking: Reported on 09/29/2020 08/20/11 03/28/19  Dayton BailiffKing, Andrew, MD  hydrocortisone (ANUSOL-HC) 2.5 % rectal cream Place 1 application rectally 2 (two) times daily. Patient not taking: Reported on 09/29/2020 03/28/19   Ward, Chase PicketJaime Pilcher,  PA-C  lisinopril (PRINIVIL,ZESTRIL) 10 MG tablet Take 1 tablet (10 mg total) by mouth daily. Patient not taking: Reported on 09/29/2020 08/20/11 08/19/12  Dayton Bailiff, MD    Physical Exam: Constitutional: Moderately built and nourished. Vitals:   09/29/20 1934 09/29/20 1951 09/29/20 2018 09/29/20 2114  BP: (!) 156/88  (!) 149/83 (!) 159/99  Pulse: 88 76 86 72  Resp: (!) 25 (!) 24  18  Temp:  99.1 F (37.3 C)  97.7 F (36.5 C)  TempSrc:  Oral  Oral  SpO2: 90% 92% 94% 91%  Weight:      Height:       Eyes: Anicteric no  pallor. ENMT: No discharge from the ears eyes nose or mouth. Neck: No mass felt.  No neck rigidity. Respiratory: No rhonchi or crepitations. Cardiovascular: S1-S2 heard. Abdomen: Soft nontender bowel sounds present. Musculoskeletal: No edema. Skin: No rash. Neurologic: Alert awake oriented to time place and person.  Moves all extremities. Psychiatric: Appears normal.  Normal affect.   Labs on Admission: I have personally reviewed following labs and imaging studies  CBC: Recent Labs  Lab 09/29/20 1532  WBC 4.1  NEUTROABS 2.3  HGB 11.9*  HCT 36.5  MCV 84.3  PLT 168   Basic Metabolic Panel: Recent Labs  Lab 09/29/20 1532  NA 133*  K 3.8  CL 100  CO2 22  GLUCOSE 149*  BUN 11  CREATININE 1.02*  CALCIUM 8.3*   GFR: Estimated Creatinine Clearance: 85.4 mL/min (A) (by C-G formula based on SCr of 1.02 mg/dL (H)). Liver Function Tests: Recent Labs  Lab 09/29/20 1532  AST 60*  ALT 33  ALKPHOS 63  BILITOT 0.2*  PROT 7.1  ALBUMIN 3.3*   No results for input(s): LIPASE, AMYLASE in the last 168 hours. No results for input(s): AMMONIA in the last 168 hours. Coagulation Profile: No results for input(s): INR, PROTIME in the last 168 hours. Cardiac Enzymes: No results for input(s): CKTOTAL, CKMB, CKMBINDEX, TROPONINI in the last 168 hours. BNP (last 3 results) No results for input(s): PROBNP in the last 8760 hours. HbA1C: No results for input(s): HGBA1C in the last 72 hours. CBG: No results for input(s): GLUCAP in the last 168 hours. Lipid Profile: Recent Labs    09/29/20 1532  TRIG 146   Thyroid Function Tests: No results for input(s): TSH, T4TOTAL, FREET4, T3FREE, THYROIDAB in the last 72 hours. Anemia Panel: Recent Labs    09/29/20 1532  FERRITIN 335*   Urine analysis: No results found for: COLORURINE, APPEARANCEUR, LABSPEC, PHURINE, GLUCOSEU, HGBUR, BILIRUBINUR, KETONESUR, PROTEINUR, UROBILINOGEN, NITRITE, LEUKOCYTESUR Sepsis  Labs: @LABRCNTIP (procalcitonin:4,lacticidven:4) ) Recent Results (from the past 240 hour(s))  Blood Culture (routine x 2)     Status: None (Preliminary result)   Collection Time: 09/29/20  3:25 PM   Specimen: BLOOD  Result Value Ref Range Status   Specimen Description   Final    BLOOD RIGHT ANTECUBITAL Performed at [facility], [address], [place], [place]    Special Requests   Final    BOTTLES DRAWN AEROBIC AND ANAEROBIC Blood Culture results may not be optimal due to an excessive volume of blood received in culture bottles Performed at [facility], [address] [place], [place]    Culture PENDING  Incomplete   Report Status PENDING  Incomplete  Respiratory Panel by RT PCR (Flu A&B, Covid) - Nasopharyngeal Swab     Status: Abnormal   Collection Time: 09/29/20  3:31  PM   Specimen: Nasopharyngeal Swab  Result Value Ref Range Status   SARS Coronavirus 2 by RT PCR POSITIVE (A) NEGATIVE Final    Comment: RESULT CALLED TO, READ BACK BY AND VERIFIED WITH: JAMIE BAILEY RN @1637  09/29/2020 OLSONM (NOTE) SARS-CoV-2 target nucleic acids are DETECTED.  SARS-CoV-2 RNA is generally detectable in upper respiratory specimens  during the acute phase of infection. Positive results are indicative of the presence of the identified virus, but do not rule out bacterial infection or co-infection with other pathogens not detected by the test. Clinical correlation with patient history and other diagnostic information is necessary to determine patient infection status. The expected result is Negative.  Fact Sheet for Patients:  10/01/2020  Fact Sheet for Healthcare Providers: https://www.moore.com/  This test is not yet approved or cleared by the https://www.young.biz/ FDA and  has been authorized for detection and/or diagnosis of SARS-CoV-2 by FDA under an Emergency Use Authorization (EUA).  This EUA  will remain in effect (meaning this test ca n be used) for the duration of  the COVID-19 declaration under Section 564(b)(1) of the Act, 21 U.S.C. section 360bbb-3(b)(1), unless the authorization is terminated or revoked sooner.      Influenza A by PCR NEGATIVE NEGATIVE Final   Influenza B by PCR NEGATIVE NEGATIVE Final    Comment: (NOTE) The Xpert Xpress SARS-CoV-2/FLU/RSV assay is intended as an aid in  the diagnosis of influenza from Nasopharyngeal swab specimens and  should not be used as a sole basis for treatment. Nasal washings and  aspirates are unacceptable for Xpert Xpress SARS-CoV-2/FLU/RSV  testing.  Fact Sheet for Patients: [place]  Fact Sheet for Healthcare Providers: https://www.moore.com/  This test is not yet approved or cleared by the https://www.young.biz/ FDA and  has been authorized for detection and/or diagnosis of SARS-CoV-2 by  FDA under an Emergency Use Authorization (EUA). This EUA will remain  in effect (meaning this test can be used) for the duration of the  Covid-19 declaration under Section 564(b)(1) of the Act, 21  U.S.C. section 360bbb-3(b)(1), unless the authorization is  terminated or revoked. Performed at [facility], [address] [place], [place]      Radiological Exams on Admission: CT Angio Chest PE W/Cm &/Or Wo Cm  Result Date: 09/29/2020 CLINICAL DATA:  COVID positive with shortness of breath. EXAM: CT ANGIOGRAPHY CHEST WITH CONTRAST TECHNIQUE: Multidetector CT imaging of the chest was performed using the standard protocol during bolus administration of intravenous contrast. Multiplanar CT image reconstructions and MIPs were obtained to evaluate the vascular anatomy. CONTRAST:  10/01/2020 OMNIPAQUE IOHEXOL 350 MG/ML SOLN COMPARISON:  None. FINDINGS: Cardiovascular: Satisfactory opacification of the pulmonary arteries to the segmental level. No evidence of pulmonary embolism.  Normal heart size. No pericardial effusion. Mediastinum/Nodes: No enlarged mediastinal, hilar, or axillary lymph nodes. Thyroid gland, trachea, and esophagus demonstrate no significant findings. Lungs/Pleura: Marked severity multifocal infiltrates are seen within the left upper lobe, right middle lobe and bilateral lower lobes. Very mild involvement of the right upper lobe is seen. There is no evidence of a pleural effusion or pneumothorax. Upper Abdomen: No acute abnormality. Musculoskeletal: No chest wall abnormality. No acute or significant osseous findings. Review of the MIP images confirms the above findings. IMPRESSION: 1. No evidence of pulmonary embolism. 2. Marked severity bilateral multifocal infiltrates. Electronically Signed   By: M.D.   On: 09/29/2020 18:31   DG Chest Port 1 View  Result Date: 09/29/2020 CLINICAL  DATA:  Shortness of breath, suspect COVID. EXAM: PORTABLE CHEST 1 VIEW COMPARISON:  None. FINDINGS: Cardiac silhouette is accentuated by low lung volumes and portable technique. Patchy airspace opacities in bilateral lung bases and the left midlung. No visible pleural effusions or pneumothorax. Borderline enlargement of cardiac silhouette which is accentuated by low lung volumes and portable technique. The visualized skeletal structures are unremarkable. IMPRESSION: Patchy airspace opacities in bilateral lung bases and the left midlung, compatible with multifocal pneumonia. Correlate with COVID testing. Electronically Signed   By: Feliberto Harts MD   On: 09/29/2020 15:25    EKG: Independently reviewed.  Normal sinus rhythm.  Assessment/Plan Principal Problem:   Acute respiratory failure due to COVID-19 [facility]) Active Problems:   COVID-19   Uncontrolled type 2 diabetes mellitus with hyperglycemia (HCC)   Essential hypertension   Acute respiratory disease due to COVID-19 virus    1. Acute respiratory failure with hypoxia secondary to COVID-19 pneumonia for  which patient has been started on IV remdesivir and I have placed patient on IV Solu-Medrol.  Discussed with patient about the indication contraindication side effects of baricitinib.  Patient has consented if required. 2. Diabetes mellitus type 2 with hyperglycemia since patient is on steroids have placed patient on Lantus 15 units which patient does not take at home along with with sliding scale coverage. 3. Hypertension on lisinopril and hydrochlorothiazide.  Note that patient's creatinine 1.02 but normal GFR.  Since patient is acute respiratory failure with hypoxia with COVID-19 infection will need close monitoring for any further worsening in inpatient status.   DVT prophylaxis: Lovenox. Code Status: Full code. Family Communication: Discussed with patient. Disposition Plan: Home. Consults called: None. Admission status: Inpatient.   Eduard Clos MD [facility] Pager [phone].  If 7PM-7AM, please contact night-coverage [email or web address] Password [email or web address]  09/29/2020, 10:50 PM

## 2020-09-30 DIAGNOSIS — I10 Essential (primary) hypertension: Secondary | ICD-10-CM

## 2020-09-30 DIAGNOSIS — J9601 Acute respiratory failure with hypoxia: Secondary | ICD-10-CM

## 2020-09-30 DIAGNOSIS — E1165 Type 2 diabetes mellitus with hyperglycemia: Secondary | ICD-10-CM

## 2020-09-30 DIAGNOSIS — J1282 Pneumonia due to coronavirus disease 2019: Secondary | ICD-10-CM

## 2020-09-30 DIAGNOSIS — U071 COVID-19: Secondary | ICD-10-CM

## 2020-09-30 LAB — GLUCOSE, CAPILLARY
Glucose-Capillary: 257 mg/dL — ABNORMAL HIGH (ref 70–99)
Glucose-Capillary: 342 mg/dL — ABNORMAL HIGH (ref 70–99)
Glucose-Capillary: 285 mg/dL — ABNORMAL HIGH (ref 70–99)
Glucose-Capillary: 384 mg/dL — ABNORMAL HIGH (ref 70–99)
Glucose-Capillary: 337 mg/dL — ABNORMAL HIGH (ref 70–99)

## 2020-09-30 LAB — COMPREHENSIVE METABOLIC PANEL
ALT: 38 U/L (ref 0–44)
AST: 66 U/L — ABNORMAL HIGH (ref 15–41)
Albumin: 3.3 g/dL — ABNORMAL LOW (ref 3.5–5.0)
Alkaline Phosphatase: 72 U/L (ref 38–126)
Anion gap: 9 (ref 5–15)
BUN: 14 mg/dL (ref 6–20)
CO2: 21 mmol/L — ABNORMAL LOW (ref 22–32)
Calcium: 8.5 mg/dL — ABNORMAL LOW (ref 8.9–10.3)
Chloride: 103 mmol/L (ref 98–111)
Creatinine, Ser: 0.86 mg/dL (ref 0.44–1.00)
GFR, Estimated: >60 mL/min (ref >60)
Glucose, Bld: 297 mg/dL — ABNORMAL HIGH (ref 70–99)
Potassium: 4.4 mmol/L (ref 3.5–5.1)
Sodium: 133 mmol/L — ABNORMAL LOW (ref 135–145)
Total Bilirubin: 0.5 mg/dL (ref 0.3–1.2)
Total Protein: 7.5 g/dL (ref 6.5–8.1)

## 2020-09-30 LAB — CBC WITH DIFFERENTIAL/PLATELET
Abs Immature Granulocytes: 0.01 10*3/uL (ref 0.00–0.07)
Basophils Absolute: 0 10*3/uL (ref 0.0–0.1)
Basophils Relative: 0 %
Eosinophils Absolute: 0 10*3/uL (ref 0.0–0.5)
Eosinophils Relative: 0 %
HCT: 38.2 % (ref 36.0–46.0)
Hemoglobin: 12.3 g/dL (ref 12.0–15.0)
Immature Granulocytes: 0 %
Lymphocytes Relative: 26 %
Lymphs Abs: 1 10*3/uL (ref 0.7–4.0)
MCH: 28 pg (ref 26.0–34.0)
MCHC: 32.2 g/dL (ref 30.0–36.0)
MCV: 86.8 fL (ref 80.0–100.0)
Monocytes Absolute: 0.1 10*3/uL (ref 0.1–1.0)
Monocytes Relative: 3 %
Neutro Abs: 2.8 10*3/uL (ref 1.7–7.7)
Neutrophils Relative %: 71 %
Platelets: 162 10*3/uL (ref 150–400)
RBC: 4.4 MIL/uL (ref 3.87–5.11)
RDW: 13.3 % (ref 11.5–15.5)
WBC: 4 10*3/uL (ref 4.0–10.5)
nRBC: 0 % (ref 0.0–0.2)

## 2020-09-30 LAB — D-DIMER, QUANTITATIVE: D-Dimer, Quant: 2.01 ug/mL-FEU — ABNORMAL HIGH (ref 0.00–0.50)

## 2020-09-30 LAB — HIV ANTIBODY (ROUTINE TESTING W REFLEX): HIV Screen 4th Generation wRfx: NONREACTIVE

## 2020-09-30 LAB — PREGNANCY, URINE: Preg Test, Ur: NEGATIVE

## 2020-09-30 LAB — C-REACTIVE PROTEIN: CRP: 7.2 mg/dL — ABNORMAL HIGH (ref <1.0)

## 2020-09-30 MED ORDER — INSULIN ASPART 100 UNIT/ML ~~LOC~~ SOLN
0.0000 [IU] | Freq: Every day | SUBCUTANEOUS | Status: DC
  Administered 2020-09-30: 4 [IU] via SUBCUTANEOUS
  Administered 2020-09-30: 3 [IU] via SUBCUTANEOUS

## 2020-09-30 MED ORDER — HYDROCOD POLST-CPM POLST ER 10-8 MG/5ML PO SUER
5.0000 mL | Freq: Two times a day (BID) | ORAL | Status: DC
  Administered 2020-09-30 – 2020-10-03 (×6): 5 mL via ORAL
  Filled 2020-09-30 (×6): qty 5

## 2020-09-30 MED ORDER — METHYLPREDNISOLONE SODIUM SUCC 125 MG IJ SOLR
50.0000 mg | Freq: Two times a day (BID) | INTRAMUSCULAR | Status: DC
  Administered 2020-09-30 – 2020-10-02 (×4): 50 mg via INTRAVENOUS
  Filled 2020-09-30 (×4): qty 2

## 2020-09-30 MED ORDER — INSULIN ASPART 100 UNIT/ML ~~LOC~~ SOLN
0.0000 [IU] | Freq: Three times a day (TID) | SUBCUTANEOUS | Status: DC
  Administered 2020-09-30: 5 [IU] via SUBCUTANEOUS
  Administered 2020-09-30: 7 [IU] via SUBCUTANEOUS
  Administered 2020-09-30: 9 [IU] via SUBCUTANEOUS
  Administered 2020-10-01: 5 [IU] via SUBCUTANEOUS
  Administered 2020-10-01: 9 [IU] via SUBCUTANEOUS

## 2020-09-30 MED ORDER — ENOXAPARIN SODIUM 60 MG/0.6ML ~~LOC~~ SOLN
55.0000 mg | SUBCUTANEOUS | Status: DC
  Administered 2020-09-30 – 2020-10-02 (×3): 55 mg via SUBCUTANEOUS
  Filled 2020-09-30 (×3): qty 0.6

## 2020-09-30 MED ORDER — HYDRALAZINE HCL 25 MG PO TABS
25.0000 mg | ORAL_TABLET | Freq: Four times a day (QID) | ORAL | Status: DC | PRN
  Administered 2020-09-30: 25 mg via ORAL
  Filled 2020-09-30: qty 1

## 2020-09-30 NOTE — Progress Notes (Signed)
Inpatient Diabetes Program Recommendations  AACE/ADA: New Consensus Statement on Inpatient Glycemic Control (2015)  Target Ranges:  Prepandial:   less than 140 mg/dL      Peak postprandial:   less than 180 mg/dL (1-2 hours)      Critically ill patients:  140 - 180 mg/dL   Lab Results  Component Value Date   GLUCAP 342 (H) 09/30/2020    Review of Glycemic Control  Diabetes history: DM2 Outpatient Diabetes medications: Ozempic, metformin 1000 mg bid Current orders for Inpatient glycemic control: Lantus 15 units QHS, Novolog 0-9 units tidwc and hs  No available HgbA1C  Inpatient Diabetes Program Recommendations:     Increase Lantus to 20 units QHS Increase Novolog to 0-15 units tidwc and hs Add meal coverage insulin - 5 units tidwc if pt eats > 50% meal  Continue to follow glucose trends.  Thank you. Ailene Ards, RD, LDN, CDE Inpatient Diabetes Coordinator [phone]

## 2020-09-30 NOTE — Progress Notes (Signed)
PROGRESS NOTE    Jessica Washington  PZW:[id number] DOB: 19-Jun-1976 DOA: 09/29/2020 PCP: Jessica Pila, FNP    Brief Narrative:  Jessica Washington was admitted to the hospital with a working diagnosis of acute hypoxic respiratory failure due to SARS COVID-19 viral pneumonia.  44 year old female with past medical history for hypertension and type 2 diabetes mellitus who reported upper respiratory tract symptoms, fevers, chills and myalgias for 1 week.  As an outpatient she had azithromycin prescribed that she took for 2 days prior to hospitalization.  Her symptoms continue to deteriorate despite outpatient management, evolving into significant dyspnea.  On her initial physical examination blood pressure 156/88, heart rate 88, respiratory rate 25, temperature 99.1, oxygen saturation 90% on supplemental oxygen.  Her lungs had no wheezing or rhonchi, heart S1-S2 present, rhythmic, abdomen was soft and nontender, no lower extremity edema. Sodium 133, potassium 3.8, chloride 100, bicarb 22, glucose 149, BUN 11, creatinine 1.0, white count 4.1, hemoglobin 11.9, hematocrit 36.5, platelets 168.  SARS COVID-19 positive. Chest radiograph with interstitial/alveolar infiltrate right lower lobe, interstitial infiltrate left upper lobe and left lower lobe. CT chest with no pulmonary embolism, diffuse, bilateral, patchy infiltrates. EKG 78 bpm, normal axis, normal intervals, sinus rhythm, no ST segment or T wave changes.    Assessment & Plan:   Principal Problem:   Pneumonia due to COVID-19 virus Active Problems:   Uncontrolled type 2 diabetes mellitus with hyperglycemia (HCC)   Essential hypertension   Acute respiratory failure with hypoxia (HCC)   1.  Acute hypoxic respiratory failure due to SARS COVID-19 viral pneumonia.  RR: 20  Pulse oxymetry: 94%  Fi02: 2.5 L/ min per [place]    COVID-19 Labs  Recent Labs    09/29/20 1532 09/29/20 1648 09/30/20 0434  DDIMER  --  1.98* 2.01*  FERRITIN 335*  --   --     LDH 435*  --   --   CRP 6.9*  --  7.2*    Lab Results  Component Value Date   SARSCOV2NAA POSITIVE (A) 09/29/2020    Patient with elevated inflammatory markers, supplemental 02 less than 5 L/min.   Continue medical therapy with high dose systemic steroids and remdesivir. If worsening inflammatory markers or oxygenation, will start patient on Baricitinib.  On bronchodilator therapy, antitussive agents and airway clearing techniques.   Out of bed to chair tid with meals. Follow inflammatory markers.   2. Uncontrolled T2DM with steroid induced hyperglycemia. Dyslipidemia. Continue basal insulin with 15 units levemir, and sliding scale, continue to calculate insulin requirements.  Patient is tolerating po well.   Continue with ezetimibe and rosuvastatin.   3. HTN. Uncontrolled blood pressure, continue with hctz, losartan and amlodipine.     Patient continue to be at high risk for worsening respiratory failure.   Status is: Inpatient  Remains inpatient appropriate because:IV treatments appropriate due to intensity of illness or inability to take PO   Dispo: The patient is from: Home              Anticipated d/c is to: Home              Anticipated d/c date is: 3 days              Patient currently is not medically stable to d/c.   DVT prophylaxis: Enoxaparin   Code Status:   full  Family Communication:  Patient communicating with her family with her personal phone.       Subjective: Patient with improved  dyspnea but not yet back to baseline, continue to have cough and dyspnea on exertion.   Objective: Vitals:   09/29/20 2114 09/30/20 0116 09/30/20 0548 09/30/20 0931  BP: (!) 159/99 (!) 157/94 (!) 148/93 (!) 172/108  Pulse: 72 70 64 70  Resp: 18 (!) 21 (!) 21 20  Temp: 97.7 F (36.5 C) 97.9 F (36.6 C) 97.7 F (36.5 C) 98 F (36.7 C)  TempSrc: Oral Oral Oral Oral  SpO2: 91% 93% 98% 94%  Weight:      Height:        Intake/Output Summary (Last 24 hours) at  09/30/2020 1008 Last data filed at 09/30/2020 0500 Gross per 24 hour  Intake 200 ml  Output --  Net 200 ml   Filed Weights   09/29/20 1416  Weight: 110.2 kg    Examination:   General: not in pain, or dyspnea at rest.  Neurology: Awake and alert, non focal  E ENT: no pallor, no icterus, oral mucosa moist Cardiovascular: No JVD. S1-S2 present, rhythmic, no gallops, rubs, or murmurs. No lower extremity edema. Pulmonary: positive breath sounds bilaterally, with no wheezing, rhonchi or rales. Gastrointestinal. Abdomen protuberant, soft and non tender Skin. No rashes Musculoskeletal: no joint deformities     Data Reviewed: I have personally reviewed following labs and imaging studies  CBC: Recent Labs  Lab 09/29/20 1532 09/30/20 0434  WBC 4.1 4.0  NEUTROABS 2.3 2.8  HGB 11.9* 12.3  HCT 36.5 38.2  MCV 84.3 86.8  PLT 168 162   Basic Metabolic Panel: Recent Labs  Lab 09/29/20 1532 09/30/20 0434  NA 133* 133*  K 3.8 4.4  CL 100 103  CO2 22 21*  GLUCOSE 149* 297*  BUN 11 14  CREATININE 1.02* 0.86  CALCIUM 8.3* 8.5*   GFR: Estimated Creatinine Clearance: 101.3 mL/min (by C-G formula based on SCr of 0.86 mg/dL). Liver Function Tests: Recent Labs  Lab 09/29/20 1532 09/30/20 0434  AST 60* 66*  ALT 33 38  ALKPHOS 63 72  BILITOT 0.2* 0.5  PROT 7.1 7.5  ALBUMIN 3.3* 3.3*   No results for input(s): LIPASE, AMYLASE in the last 168 hours. No results for input(s): AMMONIA in the last 168 hours. Coagulation Profile: No results for input(s): INR, PROTIME in the last 168 hours. Cardiac Enzymes: No results for input(s): CKTOTAL, CKMB, CKMBINDEX, TROPONINI in the last 168 hours. BNP (last 3 results) No results for input(s): PROBNP in the last 8760 hours. HbA1C: No results for input(s): HGBA1C in the last 72 hours. CBG: Recent Labs  Lab 09/30/20 0752  GLUCAP 257*   Lipid Profile: Recent Labs    09/29/20 1532  TRIG 146   Thyroid Function Tests: No results  for input(s): TSH, T4TOTAL, FREET4, T3FREE, THYROIDAB in the last 72 hours. Anemia Panel: Recent Labs    09/29/20 1532  FERRITIN 335*      Radiology Studies: I have reviewed all of the imaging during this hospital visit personally     Scheduled Meds: . amLODipine  10 mg Oral Daily  . vitamin C  500 mg Oral Daily  . enoxaparin (LOVENOX) injection  55 mg Subcutaneous Q24H  . ezetimibe  10 mg Oral Daily  . hydrochlorothiazide  50 mg Oral Daily  . insulin aspart  0-5 Units Subcutaneous QHS  . insulin aspart  0-9 Units Subcutaneous TID WC  . insulin glargine  15 Units Subcutaneous QHS  . losartan  100 mg Oral Daily  . methylPREDNISolone (SOLU-MEDROL) injection  0.5 mg/kg  Intravenous Q12H   Followed by  . [START ON 10/03/2020] predniSONE  50 mg Oral Daily  . pantoprazole  40 mg Oral Daily  . rosuvastatin  40 mg Oral Daily  . zinc sulfate  220 mg Oral Daily   Continuous Infusions: . remdesivir 100 mg in NS 100 mL       LOS: 1 day        Journie Howson Annett Gula, MD

## 2020-10-01 LAB — D-DIMER, QUANTITATIVE: D-Dimer, Quant: 0.9 ug/mL-FEU — ABNORMAL HIGH (ref 0.00–0.50)

## 2020-10-01 LAB — COMPREHENSIVE METABOLIC PANEL
ALT: 35 U/L (ref 0–44)
AST: 43 U/L — ABNORMAL HIGH (ref 15–41)
Albumin: 3.4 g/dL — ABNORMAL LOW (ref 3.5–5.0)
Alkaline Phosphatase: 67 U/L (ref 38–126)
Anion gap: 10 (ref 5–15)
BUN: 21 mg/dL — ABNORMAL HIGH (ref 6–20)
CO2: 23 mmol/L (ref 22–32)
Calcium: 8.8 mg/dL — ABNORMAL LOW (ref 8.9–10.3)
Chloride: 101 mmol/L (ref 98–111)
Creatinine, Ser: 0.99 mg/dL (ref 0.44–1.00)
GFR, Estimated: >60 mL/min (ref >60)
Glucose, Bld: 343 mg/dL — ABNORMAL HIGH (ref 70–99)
Potassium: 4.3 mmol/L (ref 3.5–5.1)
Sodium: 134 mmol/L — ABNORMAL LOW (ref 135–145)
Total Bilirubin: 0.6 mg/dL (ref 0.3–1.2)
Total Protein: 7.4 g/dL (ref 6.5–8.1)

## 2020-10-01 LAB — C-REACTIVE PROTEIN: CRP: 2.6 mg/dL — ABNORMAL HIGH (ref <1.0)

## 2020-10-01 LAB — HEMOGLOBIN A1C
Hgb A1c MFr Bld: 10 % — ABNORMAL HIGH (ref 4.8–5.6)
Mean Plasma Glucose: 240 mg/dL

## 2020-10-01 LAB — GLUCOSE, CAPILLARY
Glucose-Capillary: 291 mg/dL — ABNORMAL HIGH (ref 70–99)
Glucose-Capillary: 368 mg/dL — ABNORMAL HIGH (ref 70–99)
Glucose-Capillary: 383 mg/dL — ABNORMAL HIGH (ref 70–99)
Glucose-Capillary: 376 mg/dL — ABNORMAL HIGH (ref 70–99)

## 2020-10-01 LAB — FERRITIN: Ferritin: 470 ng/mL — ABNORMAL HIGH (ref 11–307)

## 2020-10-01 MED ORDER — INSULIN ASPART 100 UNIT/ML ~~LOC~~ SOLN
0.0000 [IU] | Freq: Three times a day (TID) | SUBCUTANEOUS | Status: DC
  Administered 2020-10-01: 15 [IU] via SUBCUTANEOUS
  Administered 2020-10-02 (×2): 11 [IU] via SUBCUTANEOUS
  Administered 2020-10-02: 15 [IU] via SUBCUTANEOUS
  Administered 2020-10-03 (×2): 8 [IU] via SUBCUTANEOUS

## 2020-10-01 MED ORDER — INSULIN GLARGINE 100 UNIT/ML ~~LOC~~ SOLN
20.0000 [IU] | Freq: Every day | SUBCUTANEOUS | Status: DC
  Administered 2020-10-01: 20 [IU] via SUBCUTANEOUS
  Filled 2020-10-01: qty 0.2

## 2020-10-01 MED ORDER — INSULIN ASPART 100 UNIT/ML ~~LOC~~ SOLN
10.0000 [IU] | Freq: Once | SUBCUTANEOUS | Status: AC
  Administered 2020-10-01: 10 [IU] via SUBCUTANEOUS

## 2020-10-01 MED ORDER — INSULIN ASPART 100 UNIT/ML ~~LOC~~ SOLN
7.0000 [IU] | Freq: Once | SUBCUTANEOUS | Status: AC
  Administered 2020-10-01: 7 [IU] via SUBCUTANEOUS

## 2020-10-01 NOTE — Progress Notes (Signed)
PROGRESS NOTE    Jessica Washington  EYC:[id number] DOB: Feb 19, 1976 DOA: 09/29/2020 PCP: Ranae Pila, FNP    Brief Narrative:  Jessica Washington was admitted to the hospital with a working diagnosis of acute hypoxic respiratory failure due to SARS COVID-19 viral pneumonia.  44 year old female with past medical history for hypertension and type 2 diabetes mellitus who reported upper respiratory tract symptoms, fevers, chills and myalgias for 1 week.  As an outpatient she had azithromycin prescribed that she took for 2 days prior to hospitalization.  Her symptoms continue to deteriorate despite outpatient management, evolving into significant dyspnea.  On her initial physical examination blood pressure 156/88, heart rate 88, respiratory rate 25, temperature 99.1, oxygen saturation 90% on supplemental oxygen.  Her lungs had no wheezing or rhonchi, heart S1-S2 present, rhythmic, abdomen was soft and nontender, no lower extremity edema. Sodium 133, potassium 3.8, chloride 100, bicarb 22, glucose 149, BUN 11, creatinine 1.0, white count 4.1, hemoglobin 11.9, hematocrit 36.5, platelets 168.  SARS COVID-19 positive. Chest radiograph with interstitial/alveolar infiltrate right lower lobe, interstitial infiltrate left upper lobe and left lower lobe. CT chest with no pulmonary embolism, diffuse, bilateral, patchy infiltrates. EKG 78 bpm, normal axis, normal intervals, sinus rhythm, no ST segment or T wave changes.   Assessment & Plan:   Principal Problem:   Pneumonia due to COVID-19 virus Active Problems:   Uncontrolled type 2 diabetes mellitus with hyperglycemia (HCC)   Essential hypertension   Acute respiratory failure with hypoxia (HCC)    1.  Acute hypoxic respiratory failure due to SARS COVID-19 viral pneumonia.  RR: 24 Pulse oxymetry: 94%  Fi02:  2 L/ min per [place]  COVID-19 Labs  Recent Labs    09/29/20 1532 09/29/20 1648 09/30/20 0434 10/01/20 0405  DDIMER  --  1.98* 2.01* 0.90*  FERRITIN  335*  --   --  470*  LDH 435*  --   --   --   CRP 6.9*  --  7.2* 2.6*    Lab Results  Component Value Date   SARSCOV2NAA POSITIVE (A) 09/29/2020    Inflammatory markers are trending down, continue to have low oxygen requirements.   Tolerating well medical therapy with high dose systemic steroids and remdesivir. Continue with bronchodilator therapy, antitussive agents and airway clearing techniques.   Continue to follow up on inflammatory markers and oxymetry.   2. Uncontrolled T2DM with steroid induced hyperglycemia. Dyslipidemia. Fating insulin this am is 343 Increase insulin levimir to 20 qhs, change to moderate sliding scale, continue to calculate insulin requirements.  No nausea or vomiting.   On ezetimibe and rosuvastatin.   3. HTN. Blood pressure this am is 146/94. Will continue with hctz, losartan and amlodipine.    4. Obesity class 3. Calculated BMI is 41,6. Will need outpatient follow up.   Status is: Inpatient  Remains inpatient appropriate because:IV treatments appropriate due to intensity of illness or inability to take PO   Dispo: The patient is from: Home              Anticipated d/c is to: Home              Anticipated d/c date is: 3 days              Patient currently is not medically stable to d/c.   DVT prophylaxis: Enoxaparin   Code Status:   full  Family Communication: patient communicating with her family with her own phone.     Subjective: Patient is feeling  better, but not yet back to baseline, continue to have dyspnea on exertion, no nausea or vomiting, no chest pain.   Objective: Vitals:   09/30/20 1356 09/30/20 1937 09/30/20 2200 10/01/20 0323  BP: (!) 157/107 124/78  130/66  Pulse: 70 75  (!) 59  Resp: 20 (!) 21  (!) 24  Temp: 98.1 F (36.7 C) (!) 97.4 F (36.3 C)  (!) 97.4 F (36.3 C)  TempSrc: Oral Oral  Axillary  SpO2: 100% 93% 94% 94%  Weight:      Height:        Intake/Output Summary (Last 24 hours) at 10/01/2020  1113 Last data filed at 09/30/2020 1855 Gross per 24 hour  Intake 590 ml  Output --  Net 590 ml   Filed Weights   09/29/20 1416  Weight: 110.2 kg    Examination:   General: Not in pain or dyspnea, deconditioned  Neurology: Awake and alert, non focal  E ENT: mild pallor, no icterus, oral mucosa moist Cardiovascular: No JVD. S1-S2 present, rhythmic, no gallops, rubs, or murmurs. No lower extremity edema. Pulmonary: positive breath sounds bilaterally, with o wheezing.  Gastrointestinal. Abdomen soft and non tender.  Skin. No rashes Musculoskeletal: no joint deformities     Data Reviewed: I have personally reviewed following labs and imaging studies  CBC: Recent Labs  Lab 09/29/20 1532 09/30/20 0434  WBC 4.1 4.0  NEUTROABS 2.3 2.8  HGB 11.9* 12.3  HCT 36.5 38.2  MCV 84.3 86.8  PLT 168 162   Basic Metabolic Panel: Recent Labs  Lab 09/29/20 1532 09/30/20 0434 10/01/20 0405  NA 133* 133* 134*  K 3.8 4.4 4.3  CL 100 103 101  CO2 22 21* 23  GLUCOSE 149* 297* 343*  BUN 11 14 21*  CREATININE 1.02* 0.86 0.99  CALCIUM 8.3* 8.5* 8.8*   GFR: Estimated Creatinine Clearance: 88 mL/min (by C-G formula based on SCr of 0.99 mg/dL). Liver Function Tests: Recent Labs  Lab 09/29/20 1532 09/30/20 0434 10/01/20 0405  AST 60* 66* 43*  ALT 33 38 35  ALKPHOS 63 72 67  BILITOT 0.2* 0.5 0.6  PROT 7.1 7.5 7.4  ALBUMIN 3.3* 3.3* 3.4*   No results for input(s): LIPASE, AMYLASE in the last 168 hours. No results for input(s): AMMONIA in the last 168 hours. Coagulation Profile: No results for input(s): INR, PROTIME in the last 168 hours. Cardiac Enzymes: No results for input(s): CKTOTAL, CKMB, CKMBINDEX, TROPONINI in the last 168 hours. BNP (last 3 results) No results for input(s): PROBNP in the last 8760 hours. HbA1C: Recent Labs    09/30/20 0434  HGBA1C 10.0*   CBG: Recent Labs  Lab 09/30/20 0752 09/30/20 1115 09/30/20 1647 09/30/20 2114 10/01/20 0746  GLUCAP  257* 342* 384* 337* 291*   Lipid Profile: Recent Labs    09/29/20 1532  TRIG 146   Thyroid Function Tests: No results for input(s): TSH, T4TOTAL, FREET4, T3FREE, THYROIDAB in the last 72 hours. Anemia Panel: Recent Labs    09/29/20 1532 10/01/20 0405  FERRITIN 335* 470*      Radiology Studies: I have reviewed all of the imaging during this hospital visit personally     Scheduled Meds: . amLODipine  10 mg Oral Daily  . vitamin C  500 mg Oral Daily  . chlorpheniramine-HYDROcodone  5 mL Oral Q12H  . enoxaparin (LOVENOX) injection  55 mg Subcutaneous Q24H  . ezetimibe  10 mg Oral Daily  . hydrochlorothiazide  50 mg Oral Daily  .  insulin aspart  0-5 Units Subcutaneous QHS  . insulin aspart  0-9 Units Subcutaneous TID WC  . insulin glargine  15 Units Subcutaneous QHS  . losartan  100 mg Oral Daily  . methylPREDNISolone (SOLU-MEDROL) injection  50 mg Intravenous Q12H  . pantoprazole  40 mg Oral Daily  . rosuvastatin  40 mg Oral Daily  . zinc sulfate  220 mg Oral Daily   Continuous Infusions: . remdesivir 100 mg in NS 100 mL 100 mg (10/01/20 1019)     LOS: 2 days        Jaena Brocato Annett Gula, MD

## 2020-10-02 LAB — COMPREHENSIVE METABOLIC PANEL
ALT: 35 U/L (ref 0–44)
AST: 38 U/L (ref 15–41)
Albumin: 3.3 g/dL — ABNORMAL LOW (ref 3.5–5.0)
Alkaline Phosphatase: 71 U/L (ref 38–126)
Anion gap: 11 (ref 5–15)
BUN: 25 mg/dL — ABNORMAL HIGH (ref 6–20)
CO2: 25 mmol/L (ref 22–32)
Calcium: 9 mg/dL (ref 8.9–10.3)
Chloride: 98 mmol/L (ref 98–111)
Creatinine, Ser: 1.06 mg/dL — ABNORMAL HIGH (ref 0.44–1.00)
GFR, Estimated: >60 mL/min (ref >60)
Glucose, Bld: 347 mg/dL — ABNORMAL HIGH (ref 70–99)
Potassium: 4.4 mmol/L (ref 3.5–5.1)
Sodium: 134 mmol/L — ABNORMAL LOW (ref 135–145)
Total Bilirubin: 0.3 mg/dL (ref 0.3–1.2)
Total Protein: 7.1 g/dL (ref 6.5–8.1)

## 2020-10-02 LAB — GLUCOSE, CAPILLARY
Glucose-Capillary: 309 mg/dL — ABNORMAL HIGH (ref 70–99)
Glucose-Capillary: 330 mg/dL — ABNORMAL HIGH (ref 70–99)
Glucose-Capillary: 384 mg/dL — ABNORMAL HIGH (ref 70–99)
Glucose-Capillary: 299 mg/dL — ABNORMAL HIGH (ref 70–99)

## 2020-10-02 LAB — C-REACTIVE PROTEIN: CRP: 0.8 mg/dL (ref <1.0)

## 2020-10-02 LAB — D-DIMER, QUANTITATIVE: D-Dimer, Quant: 0.62 ug/mL-FEU — ABNORMAL HIGH (ref 0.00–0.50)

## 2020-10-02 LAB — FERRITIN: Ferritin: 558 ng/mL — ABNORMAL HIGH (ref 11–307)

## 2020-10-02 MED ORDER — INSULIN ASPART 100 UNIT/ML ~~LOC~~ SOLN
3.0000 [IU] | Freq: Three times a day (TID) | SUBCUTANEOUS | Status: DC
  Administered 2020-10-02 – 2020-10-03 (×4): 3 [IU] via SUBCUTANEOUS

## 2020-10-02 MED ORDER — INSULIN GLARGINE 100 UNIT/ML ~~LOC~~ SOLN
20.0000 [IU] | Freq: Two times a day (BID) | SUBCUTANEOUS | Status: DC
  Administered 2020-10-02 – 2020-10-03 (×3): 20 [IU] via SUBCUTANEOUS
  Filled 2020-10-02 (×3): qty 0.2

## 2020-10-02 MED ORDER — METHYLPREDNISOLONE SODIUM SUCC 125 MG IJ SOLR
50.0000 mg | Freq: Every day | INTRAMUSCULAR | Status: DC
  Administered 2020-10-03: 50 mg via INTRAVENOUS
  Filled 2020-10-02: qty 2

## 2020-10-02 NOTE — Progress Notes (Signed)
PROGRESS NOTE    Jessica Washington  WFU:[id number] DOB: 24-Oct-1976 DOA: 09/29/2020 PCP: Jessica Pila, FNP    Brief Narrative:  Jessica Washington was admitted to the hospital with a working diagnosis of acute hypoxic respiratory failure due to SARS COVID-19 viral pneumonia.  44 year old female with past medical history for hypertension and type 2 diabetes mellitus who reported upper respiratory tract symptoms, fevers, chills and myalgias for 1 week. As an outpatient she had azithromycin prescribed that she took for 2 days prior to hospitalization. Her symptoms continue to deteriorate despite outpatient management, evolving into significant dyspnea. On her initial physical examination blood pressure 156/88, heart rate 88, respiratory rate 25, temperature 99.1, oxygen saturation 90% on supplemental oxygen. Her lungs had no wheezing or rhonchi, heart S1-S2 present, rhythmic, abdomen was soft and nontender, no lower extremity edema. Sodium 133, potassium 3.8, chloride 100, bicarb 22, glucose 149, BUN 11, creatinine 1.0, white count 4.1, hemoglobin 11.9, hematocrit 36.5, platelets 168. SARS COVID-19 positive. Chest radiograph with interstitial/alveolar infiltrate right lower lobe, interstitial infiltrate left upper lobe and left lower lobe. CT chest with no pulmonary embolism, diffuse, bilateral, patchy infiltrates. EKG 78 bpm, normal axis, normal intervals, sinus rhythm, no ST segment or T wave changes.  Patient has been placed on medical therapy with systemic corticosteroids and remdesivir.  Continue to have low oxygen requirements and improving inflammatory markers,    Assessment & Plan:   Principal Problem:   Pneumonia due to COVID-19 virus Active Problems:   Uncontrolled type 2 diabetes mellitus with hyperglycemia (HCC)   Essential hypertension   Acute respiratory failure with hypoxia (HCC)   1.Acute hypoxic respiratory failure due to SARS COVID-19 viral pneumonia.  RR: 16  Pulse  oxymetry: 92%  Fi02: room air.   COVID-19 Labs  Recent Labs    09/29/20 1532 09/29/20 1648 09/30/20 0434 10/01/20 0405 10/02/20 0401  DDIMER  --    < > 2.01* 0.90* 0.62*  FERRITIN 335*  --   --  470* 558*  LDH 435*  --   --   --   --   CRP 6.9*   < > 7.2* 2.6* 0.8   < > = values in this interval not displayed.    Lab Results  Component Value Date   SARSCOV2NAA POSITIVE (A) 09/29/2020    Today patient is on ambient air with good toleration, inflammatory markers are improving. Out of bed to chair.   Continue with  systemic steroids, decrease dose to 50 mg daily. Antiviral therapy with remdesivir #4/5. On bronchodilator therapy, antitussive agents and airway clearing techniques.   If patient continue to improve, plan for discharge home in am.    2. Uncontrolled T2DM with steroid induced hyperglycemia. Dyslipidemia.Continue with hyperglycemia, fasting glucose this am is 347 mg/dl, capillary 202 and 542. Increase levimir to 20 bid, continue with moderate sliding scale. Taper steroids.  Patient tolerating po well.   Continue with ezetimibe and rosuvastatin.   3. HTN. Blood pressure control with hctz, losartan and amlodipine.  4. Obesity class 3. Calculated BMI is 41,6. Outpatient follow up.    Status is: Inpatient  Remains inpatient appropriate because:IV treatments appropriate due to intensity of illness or inability to take PO   Dispo: The patient is from: Home              Anticipated d/c is to: Home              Anticipated d/c date is: 1 day  Patient currently is not medically stable to d/c. Plan for dc home in am if contin to improve.    DVT prophylaxis: Enoxaparin   Code Status:   full  Family Communication:  Patient communicating with her family over the phone.       Subjective: Patient with no nausea or vomiting, now she is on room air, dyspnea continue to improve.   Objective: Vitals:   10/01/20 0832 10/01/20 1333 10/01/20 2145  10/02/20 0515  BP:  (!) 146/94 (!) 147/87 (!) 154/91  Pulse:  66 (!) 58 (!) 55  Resp:  20 20 16   Temp:  (!) 97.5 F (36.4 C) 97.6 F (36.4 C) 97.7 F (36.5 C)  TempSrc:  Oral Oral Oral  SpO2: 96% 96% 95% 92%  Weight:      Height:        Intake/Output Summary (Last 24 hours) at 10/02/2020 0932 Last data filed at 10/02/2020 10/04/2020 Gross per 24 hour  Intake 480 ml  Output --  Net 480 ml   Filed Weights   09/29/20 1416  Weight: 110.2 kg    Examination:   General: Not in pain or dyspnea,  Neurology: Awake and alert, non focal  E ENT: mild pallor, no icterus, oral mucosa moist Cardiovascular: No JVD. S1-S2 present, rhythmic, no gallops, rubs, or murmurs. No lower extremity edema. Pulmonary: positive breath sounds bilaterally, adequate air movement, no wheezing, rhonchi or rales. Gastrointestinal. Abdomen soft and non tender.  Skin. No rashes Musculoskeletal: no joint deformities     Data Reviewed: I have personally reviewed following labs and imaging studies  CBC: Recent Labs  Lab 09/29/20 1532 09/30/20 0434  WBC 4.1 4.0  NEUTROABS 2.3 2.8  HGB 11.9* 12.3  HCT 36.5 38.2  MCV 84.3 86.8  PLT 168 162   Basic Metabolic Panel: Recent Labs  Lab 09/29/20 1532 09/30/20 0434 10/01/20 0405 10/02/20 0401  NA 133* 133* 134* 134*  K 3.8 4.4 4.3 4.4  CL 100 103 101 98  CO2 22 21* 23 25  GLUCOSE 149* 297* 343* 347*  BUN 11 14 21* 25*  CREATININE 1.02* 0.86 0.99 1.06*  CALCIUM 8.3* 8.5* 8.8* 9.0   GFR: Estimated Creatinine Clearance: 82.2 mL/min (A) (by C-G formula based on SCr of 1.06 mg/dL (H)). Liver Function Tests: Recent Labs  Lab 09/29/20 1532 09/30/20 0434 10/01/20 0405 10/02/20 0401  AST 60* 66* 43* 38  ALT 33 38 35 35  ALKPHOS 63 72 67 71  BILITOT 0.2* 0.5 0.6 0.3  PROT 7.1 7.5 7.4 7.1  ALBUMIN 3.3* 3.3* 3.4* 3.3*   No results for input(s): LIPASE, AMYLASE in the last 168 hours. No results for input(s): AMMONIA in the last 168  hours. Coagulation Profile: No results for input(s): INR, PROTIME in the last 168 hours. Cardiac Enzymes: No results for input(s): CKTOTAL, CKMB, CKMBINDEX, TROPONINI in the last 168 hours. BNP (last 3 results) No results for input(s): PROBNP in the last 8760 hours. HbA1C: Recent Labs    09/30/20 0434  HGBA1C 10.0*   CBG: Recent Labs  Lab 10/01/20 0746 10/01/20 1132 10/01/20 1618 10/01/20 2147 10/02/20 0740  GLUCAP 291* 368* 383* 376* 309*   Lipid Profile: Recent Labs    09/29/20 1532  TRIG 146   Thyroid Function Tests: No results for input(s): TSH, T4TOTAL, FREET4, T3FREE, THYROIDAB in the last 72 hours. Anemia Panel: Recent Labs    10/01/20 0405 10/02/20 0401  FERRITIN 470* 558*      Radiology Studies: I  have reviewed all of the imaging during this hospital visit personally     Scheduled Meds: . amLODipine  10 mg Oral Daily  . vitamin C  500 mg Oral Daily  . chlorpheniramine-HYDROcodone  5 mL Oral Q12H  . enoxaparin (LOVENOX) injection  55 mg Subcutaneous Q24H  . ezetimibe  10 mg Oral Daily  . hydrochlorothiazide  50 mg Oral Daily  . insulin aspart  0-15 Units Subcutaneous TID WC  . insulin glargine  20 Units Subcutaneous QHS  . losartan  100 mg Oral Daily  . methylPREDNISolone (SOLU-MEDROL) injection  50 mg Intravenous Q12H  . pantoprazole  40 mg Oral Daily  . rosuvastatin  40 mg Oral Daily  . zinc sulfate  220 mg Oral Daily   Continuous Infusions: . remdesivir 100 mg in NS 100 mL 100 mg (10/01/20 1019)     LOS: 3 days        Derrico Zhong Annett Gula, MD  \

## 2020-10-03 LAB — D-DIMER, QUANTITATIVE: D-Dimer, Quant: 0.38 ug/mL-FEU (ref 0.00–0.50)

## 2020-10-03 LAB — GLUCOSE, CAPILLARY
Glucose-Capillary: 286 mg/dL — ABNORMAL HIGH (ref 70–99)
Glucose-Capillary: 252 mg/dL — ABNORMAL HIGH (ref 70–99)

## 2020-10-03 LAB — C-REACTIVE PROTEIN: CRP: 1.3 mg/dL — ABNORMAL HIGH (ref <1.0)

## 2020-10-03 LAB — COMPREHENSIVE METABOLIC PANEL
ALT: 44 U/L (ref 0–44)
AST: 42 U/L — ABNORMAL HIGH (ref 15–41)
Albumin: 3.3 g/dL — ABNORMAL LOW (ref 3.5–5.0)
Alkaline Phosphatase: 71 U/L (ref 38–126)
Anion gap: 10 (ref 5–15)
BUN: 28 mg/dL — ABNORMAL HIGH (ref 6–20)
CO2: 25 mmol/L (ref 22–32)
Calcium: 8.7 mg/dL — ABNORMAL LOW (ref 8.9–10.3)
Chloride: 96 mmol/L — ABNORMAL LOW (ref 98–111)
Creatinine, Ser: 1.08 mg/dL — ABNORMAL HIGH (ref 0.44–1.00)
GFR, Estimated: >60 mL/min (ref >60)
Glucose, Bld: 401 mg/dL — ABNORMAL HIGH (ref 70–99)
Potassium: 4.3 mmol/L (ref 3.5–5.1)
Sodium: 131 mmol/L — ABNORMAL LOW (ref 135–145)
Total Bilirubin: 0.6 mg/dL (ref 0.3–1.2)
Total Protein: 7.2 g/dL (ref 6.5–8.1)

## 2020-10-03 LAB — FERRITIN: Ferritin: 483 ng/mL — ABNORMAL HIGH (ref 11–307)

## 2020-10-03 MED ORDER — ALBUTEROL SULFATE HFA 108 (90 BASE) MCG/ACT IN AERS: 1.0000 | INHALATION_SPRAY | Freq: Four times a day (QID) | RESPIRATORY_TRACT | 0 refills | Status: AC | PRN

## 2020-10-03 MED ORDER — ACETAMINOPHEN 325 MG PO TABS: 650.0000 mg | ORAL_TABLET | Freq: Four times a day (QID) | ORAL | Status: AC | PRN

## 2020-10-03 MED ORDER — GUAIFENESIN-DM 100-10 MG/5ML PO SYRP: 5.0000 mL | ORAL_SOLUTION | Freq: Four times a day (QID) | ORAL | 0 refills | Status: AC | PRN

## 2020-10-03 NOTE — Progress Notes (Signed)
Walking O2 sats 97% on room air.

## 2020-10-03 NOTE — Plan of Care (Signed)
Pt has met goals for discharge. Pt going home with prescriptions ordered and no need for oxygen, as pt ambulatory O2 sats 97% on room air.

## 2020-10-03 NOTE — Progress Notes (Signed)
Pt going home this afternoon with family. Alert and oriented, pt aware of followup with PCP and discharge needs. Discharge instructions given/explained with pt verbalizing understanding.

## 2020-10-03 NOTE — Discharge Summary (Addendum)
Physician Discharge Summary  Mickel Baaseresa Hudon ZOX:[id number] DOB: [birth year] DOA: 09/29/2020  PCP: Ranae PilaEe, Lena, FNP  Admit date: 09/29/2020 Discharge date: 10/03/2020  Admitted From: Home  Disposition:  Home   Recommendations for Outpatient Follow-up and new medication changes:  1. Follow up with Thomasenia SalesLena FNP in 2 weeks. 2. Continue self quarantine for 2 weeks, use a mask in public and maintain physical distancing.   Home Health: no   Equipment/Devices: no    Discharge Condition: stable  CODE STATUS: full  Diet recommendation: heart healthy and diabetic prudent.   Brief/Interim Summary: Jessica Washington was admitted to the hospital with a working diagnosis of acute hypoxic respiratory failure due to SARS COVID-19 viral pneumonia.  44 year old female with past medical history for hypertension and type 2 diabetes mellitus who reported upper respiratory tract symptoms, fevers, chills and myalgias for 1 week. As an outpatient she had azithromycin prescribed that she took for 2 days prior to hospitalization. Her symptoms continue to deteriorate despite outpatient management, evolving into significant dyspnea. On her initial physical examination blood pressure 156/88, heart rate 88, respiratory rate 25, temperature 99.1, oxygen saturation 90% on supplemental oxygen. Her lungs had no wheezing or rhonchi, heart S1-S2 present, rhythmic, abdomen was soft and nontender, no lower extremity edema. Sodium 133, potassium 3.8, chloride 100, bicarb 22, glucose 149, BUN 11, creatinine 1.0, white count 4.1, hemoglobin 11.9, hematocrit 36.5, platelets 168. SARS COVID-19 positive. Chest radiograph with interstitial/alveolar infiltrate right lower lobe, interstitial infiltrate left upper lobe and left lower lobe. CT chest with no pulmonary embolism, diffuse, bilateral, patchy infiltrates. EKG 78 bpm, normal axis, normal intervals, sinus rhythm, no ST segment or T wave changes.  Patient has been placed on medical  therapy with systemic corticosteroids and remdesivir.  Continue to have low oxygen requirements and improving inflammatory markers.  1.  Acute hypoxic respiratory failure due to SARS COVID-19 viral pneumonia.  Patient was admitted to the medical ward, she was placed on supplemental oxygen, medical therapy with remdesivir and systemic corticosteroids/methylprednisolone.  Patient was treated with antitussive agents, bronchodilators, and airway clearing techniques. Inflammatory markers, oxygen requirements and symptoms improved.  COVID-19 Labs  Recent Labs    10/01/20 0405 10/02/20 0401 10/03/20 0344  DDIMER 0.90* 0.62* 0.38  FERRITIN 470* 558* 483*  CRP 2.6* 0.8 1.3*    Lab Results  Component Value Date   SARSCOV2NAA POSITIVE (A) 09/29/2020   At discharge her oximetry on room air  was 95%.  Patient will follow up as an outpatient within 2 weeks.  2.  Uncontrolled type 2 diabetes mellitus, steroid-induced hyperglycemia/dyslipidemia.  Patient had severe hyperglycemia, required high doses of insulin.  At discharge will discontinue prednisone, continue home regimen for diabetes mellitus with semaglutide and metformin.  Will need close follow-up as an outpatient.   3.  Hypertension.  Continue blood pressure control with hydrochlorothiazide, losartan and amlodipine.  4.  Obesity class III.  Her calculated BMI is 41.6, will need outpatient follow-up.     Discharge Diagnoses:  Principal Problem:   Pneumonia due to COVID-19 virus Active Problems:   Uncontrolled type 2 diabetes mellitus with hyperglycemia (HCC)   Essential hypertension   Acute respiratory failure with hypoxia [facility](HCC)    Discharge Instructions   Allergies as of 10/03/2020      Reactions   Lisinopril Swelling      Medication List    STOP taking these medications   azithromycin 250 MG tablet Commonly known as: ZITHROMAX   cyclobenzaprine 10 MG tablet Commonly known  as: FLEXERIL   hydrocortisone 2.5 %  rectal cream Commonly known as: Anusol-HC   lisinopril 10 MG tablet Commonly known as: ZESTRIL     TAKE these medications   acetaminophen 325 MG tablet Commonly known as: TYLENOL Take 2 tablets (650 mg total) by mouth every 6 (six) hours as needed for mild pain (or Fever >/= 101).   albuterol 108 (90 Base) MCG/ACT inhaler Commonly known as: VENTOLIN HFA Inhale 1 puff into the lungs every 6 (six) hours as needed for wheezing or shortness of breath.   amLODipine 10 MG tablet Commonly known as: NORVASC Take 10 mg by mouth daily.   benzonatate 100 MG capsule Commonly known as: TESSALON Take 100 mg by mouth 3 (three) times daily as needed for cough.   ezetimibe 10 MG tablet Commonly known as: ZETIA Take 10 mg by mouth daily.   guaiFENesin-dextromethorphan 100-10 MG/5ML syrup Commonly known as: ROBITUSSIN DM Take 5 mLs by mouth every 6 (six) hours as needed for cough.   hydrochlorothiazide 50 MG tablet Commonly known as: HYDRODIURIL Take 50 mg by mouth daily. What changed: Another medication with the same name was removed. Continue taking this medication, and follow the directions you see here.   losartan 100 MG tablet Commonly known as: COZAAR Take 100 mg by mouth daily.   metFORMIN 1000 MG tablet Commonly known as: GLUCOPHAGE Take 1,000 mg by mouth 2 (two) times daily with a meal.   omeprazole 40 MG capsule Commonly known as: PRILOSEC Take 40 mg by mouth daily as needed (acid reflux).   rosuvastatin 40 MG tablet Commonly known as: CRESTOR Take 40 mg by mouth daily.   Semaglutide 14 MG Tabs Take 14 mg by mouth daily.       Allergies  Allergen Reactions  . Lisinopril Swelling       Procedures/Studies: CT Angio Chest PE W/Cm &/Or Wo Cm  Result Date: 09/29/2020 CLINICAL DATA:  COVID positive with shortness of breath. EXAM: CT ANGIOGRAPHY CHEST WITH CONTRAST TECHNIQUE: Multidetector CT imaging of the chest was performed using the standard protocol during  bolus administration of intravenous contrast. Multiplanar CT image reconstructions and MIPs were obtained to evaluate the vascular anatomy. CONTRAST:  OMNIPAQUE IOHEXOL 350 MG/ML SOLN COMPARISON:  None. FINDINGS: Cardiovascular: Satisfactory opacification of the pulmonary arteries to the segmental level. No evidence of pulmonary embolism. Normal heart size. No pericardial effusion. Mediastinum/Nodes: No enlarged mediastinal, hilar, or axillary lymph nodes. Thyroid gland, trachea, and esophagus demonstrate no significant findings. Lungs/Pleura: Marked severity multifocal infiltrates are seen within the left upper lobe, right middle lobe and bilateral lower lobes. Very mild involvement of the right upper lobe is seen. There is no evidence of a pleural effusion or pneumothorax. Upper Abdomen: No acute abnormality. Musculoskeletal: No chest wall abnormality. No acute or significant osseous findings. Review of the MIP images confirms the above findings. IMPRESSION: 1. No evidence of pulmonary embolism. 2. Marked severity bilateral multifocal infiltrates. Electronically Signed   By: Aram Candela M.D.   On: 09/29/2020 18:31   DG Chest Port 1 View  Result Date: 09/29/2020 CLINICAL DATA:  Shortness of breath, suspect COVID. EXAM: PORTABLE CHEST 1 VIEW COMPARISON:  None. FINDINGS: Cardiac silhouette is accentuated by low lung volumes and portable technique. Patchy airspace opacities in bilateral lung bases and the left midlung. No visible pleural effusions or pneumothorax. Borderline enlargement of cardiac silhouette which is accentuated by low lung volumes and portable technique. The visualized skeletal structures are unremarkable. IMPRESSION: Patchy airspace opacities  in bilateral lung bases and the left midlung, compatible with multifocal pneumonia. Correlate with COVID testing. Electronically Signed   By: Feliberto Harts MD   On: 09/29/2020 15:25       Subjective: Patient is feeling better, her  dyspnea continue to improve, no nausea or vomiting.   Discharge Exam: Vitals:   10/02/20 2059 10/03/20 0500  BP: (!) 147/92 140/86  Pulse: (!) 59 (!) 51  Resp: 18 16  Temp: 98 F (36.7 C) 97.9 F (36.6 C)  SpO2: 91% 95%   Vitals:   10/02/20 0515 10/02/20 1347 10/02/20 2059 10/03/20 0500  BP: (!) 154/91 (!) 168/104 (!) 147/92 140/86  Pulse: (!) 55 63 (!) 59 (!) 51  Resp: Temp: 97.7 F (36.5 C) (!) 97.1 F (36.2 C) 98 F (36.7 C) 97.9 F (36.6 C)  TempSrc: Oral  Oral Oral  SpO2: 92% 96% 91% 95%  Weight:      Height:        General: Not in pain or dyspnea.  Neurology: Awake and alert, non focal  E ENT: no pallor, no icterus, oral mucosa moist Cardiovascular: No JVD. S1-S2 present, rhythmic, no gallops, rubs, or murmurs. No lower extremity edema. Pulmonary: positive breath sounds bilaterally, Gastrointestinal. Abdomen soft and non tender Skin. No rashes Musculoskeletal: no joint deformities   The results of significant diagnostics from this hospitalization (including imaging, microbiology, ancillary and laboratory) are listed below for reference.     Microbiology: Recent Results (from the past 240 hour(s))  Blood Culture (routine x 2)     Status: None (Preliminary result)   Collection Time: 09/29/20  3:25 PM   Specimen: BLOOD  Result Value Ref Range Status   Specimen Description   Final    BLOOD RIGHT ANTECUBITAL Performed at [facility], [address] [place], [place] [address]    Special Requests   Final    BOTTLES DRAWN AEROBIC AND ANAEROBIC Blood Culture results may not be optimal due to an excessive volume of blood received in culture bottles Performed at [facility], [address] [place], [place] [address]    Culture   Final    NO GROWTH 3 DAYS Performed at [facility], [address], [place], [place] [address]    Report Status PENDING  Incomplete  Respiratory Panel by RT PCR (Flu A&B, Covid) - Nasopharyngeal  Swab     Status: Abnormal   Collection Time: 09/29/20  3:31 PM   Specimen: Nasopharyngeal Swab  Result Value Ref Range Status   SARS Coronavirus 2 by RT PCR POSITIVE (A) NEGATIVE Final    Comment: RESULT CALLED TO, READ BACK BY AND VERIFIED WITH: JAMIE BAILEY RN  09/29/2020 OLSONM (NOTE) SARS-CoV-2 target nucleic acids are DETECTED.  SARS-CoV-2 RNA is generally detectable in upper respiratory specimens  during the acute phase of infection. Positive results are indicative of the presence of the identified virus, but do not rule out bacterial infection or co-infection with other pathogens not detected by the test. Clinical correlation with patient history and other diagnostic information is necessary to determine patient infection status. The expected result is Negative.  Fact Sheet for Patients:  https://www.moore.com/  Fact Sheet for Healthcare Providers: https://www.young.biz/  This test is not yet approved or cleared by the [place] FDA and  has been authorized for detection and/or diagnosis of SARS-CoV-2 by FDA under an Emergency Use Authorization (EUA).  This EUA will remain in effect (  meaning this test ca n be used) for the duration of  the COVID-19 declaration under Section 564(b)(1) of the Act, 21 U.S.C. section 360bbb-3(b)(1), unless the authorization is terminated or revoked sooner.      Influenza A by PCR NEGATIVE NEGATIVE Final   Influenza B by PCR NEGATIVE NEGATIVE Final    Comment: (NOTE) The Xpert Xpress SARS-CoV-2/FLU/RSV assay is intended as an aid in  the diagnosis of influenza from Nasopharyngeal swab specimens and  should not be used as a sole basis for treatment. Nasal washings and  aspirates are unacceptable for Xpert Xpress SARS-CoV-2/FLU/RSV  testing.  Fact Sheet for Patients: https://www.moore.com/  Fact Sheet for Healthcare  Providers: https://www.young.biz/  This test is not yet approved or cleared by the [place] FDA and  has been authorized for detection and/or diagnosis of SARS-CoV-2 by  FDA under an Emergency Use Authorization (EUA). This EUA will remain  in effect (meaning this test can be used) for the duration of the  Covid-19 declaration under Section 564(b)(1) of the Act, 21  U.S.C. section 360bbb-3(b)(1), unless the authorization is  terminated or revoked. Performed at [facility], [address] [place], [place] [address]   Blood Culture (routine x 2)     Status: None (Preliminary result)   Collection Time: 09/29/20  4:48 PM   Specimen: BLOOD  Result Value Ref Range Status   Specimen Description   Final    BLOOD BLOOD RIGHT FOREARM Performed at [facility], [address] [place], [place] [address]    Special Requests   Final    BOTTLES DRAWN AEROBIC AND ANAEROBIC Blood Culture adequate volume Performed at [facility], [address] [place], [place] [address]    Culture   Final    NO GROWTH 3 DAYS Performed at [facility], [address] [place], [place] [address]    Report Status PENDING  Incomplete     Labs: BNP (last 3 results) No results for input(s): BNP in the last 8760 hours. Basic Metabolic Panel: Recent Labs  Lab 09/29/20 1532 09/30/20 0434 10/01/20 0405 10/02/20 0401 10/03/20 0344  NA 133* 133* 134* 134* 131*  K 3.8 4.4 4.3 4.4 4.3  CL 100 103 101 98 96*  CO2 22 21* 23 25 25   GLUCOSE 149* 297* 343* 347* 401*  BUN 11 14 21* 25* 28*  CREATININE 1.02* 0.86 0.99 1.06* 1.08*  CALCIUM 8.3* 8.5* 8.8* 9.0 8.7*   Liver Function Tests: Recent Labs  Lab 09/29/20 1532 09/30/20 0434 10/01/20 0405 10/02/20 0401 10/03/20 0344  AST 60* 66* 43* 38 42*  ALT 33 38 35 35 44  ALKPHOS 63 72 67 71 71  BILITOT 0.2* 0.5 0.6 0.3 0.6  PROT 7.1 7.5 7.4 7.1 7.2  ALBUMIN 3.3* 3.3* 3.4* 3.3* 3.3*   No results  for input(s): LIPASE, AMYLASE in the last 168 hours. No results for input(s): AMMONIA in the last 168 hours. CBC: Recent Labs  Lab 09/29/20 1532 09/30/20 0434  WBC 4.1 4.0  NEUTROABS 2.3 2.8  HGB 11.9* 12.3  HCT 36.5 38.2  MCV 84.3 86.8  PLT 168 162   Cardiac Enzymes: No results for input(s): CKTOTAL, CKMB, CKMBINDEX, TROPONINI in the last 168 hours. BNP: Invalid input(s): POCBNP CBG: Recent Labs  Lab 10/02/20 0740 10/02/20 1148 10/02/20 1657 10/02/20 2059 10/03/20 0728  GLUCAP 309* 330* 384* 299* 286*   D-Dimer Recent Labs    10/02/20 0401 10/03/20  0344  DDIMER 0.62* 0.38   Hgb A1c No results for input(s): HGBA1C in the last 72 hours. Lipid Profile No results for input(s): CHOL, HDL, LDLCALC, TRIG, CHOLHDL, LDLDIRECT in the last 72 hours. Thyroid function studies No results for input(s): TSH, T4TOTAL, T3FREE, THYROIDAB in the last 72 hours.  Invalid input(s): FREET3 Anemia work up Recent Labs    10/02/20 0401 10/03/20 0344  FERRITIN 558* 483*   Urinalysis No results found for: COLORURINE, APPEARANCEUR, LABSPEC, PHURINE, GLUCOSEU, HGBUR, BILIRUBINUR, KETONESUR, PROTEINUR, UROBILINOGEN, NITRITE, LEUKOCYTESUR Sepsis Labs Invalid input(s): PROCALCITONIN,  WBC,  LACTICIDVEN Microbiology Recent Results (from the past 240 hour(s))  Blood Culture (routine x 2)     Status: None (Preliminary result)   Collection Time: 09/29/20  3:25 PM   Specimen: BLOOD  Result Value Ref Range Status   Specimen Description   Final    BLOOD RIGHT ANTECUBITAL Performed at [facility], [address], [place], [place] [address]    Special Requests   Final    BOTTLES DRAWN AEROBIC AND ANAEROBIC Blood Culture results may not be optimal due to an excessive volume of blood received in culture bottles Performed at [facility], [address] [place], [place] [address]    Culture   Final    NO GROWTH 3 DAYS Performed at [facility], [address]  [place], [place] [address]    Report Status PENDING  Incomplete  Respiratory Panel by RT PCR (Flu A&B, Covid) - Nasopharyngeal Swab     Status: Abnormal   Collection Time: 09/29/20  3:31 PM   Specimen: Nasopharyngeal Swab  Result Value Ref Range Status   SARS Coronavirus 2 by RT PCR POSITIVE (A) NEGATIVE Final    Comment: RESULT CALLED TO, READ BACK BY AND VERIFIED WITH: April Holding RN @[id number]  09/29/2020 OLSONM (NOTE) SARS-CoV-2 target nucleic acids are DETECTED.  SARS-CoV-2 RNA is generally detectable in upper respiratory specimens  during the acute phase of infection. Positive results are indicative of the presence of the identified virus, but do not rule out bacterial infection or co-infection with other pathogens not detected by the test. Clinical correlation with patient history and other diagnostic information is necessary to determine patient infection status. The expected result is Negative.  Fact Sheet for Patients:  10/01/2020  Fact Sheet for Healthcare Providers: https://www.moore.com/  This test is not yet approved or cleared by the https://www.young.biz/ FDA and  has been authorized for detection and/or diagnosis of SARS-CoV-2 by FDA under an Emergency Use Authorization (EUA).  This EUA will remain in effect (meaning this test ca n be used) for the duration of  the COVID-19 declaration under Section 564(b)(1) of the Act, 21 U.S.C. section 360bbb-3(b)(1), unless the authorization is terminated or revoked sooner.      Influenza A by PCR NEGATIVE NEGATIVE Final   Influenza B by PCR NEGATIVE NEGATIVE Final    Comment: (NOTE) The Xpert Xpress SARS-CoV-2/FLU/RSV assay is intended as an aid in  the diagnosis of influenza from Nasopharyngeal swab specimens and  should not be used as a sole basis for treatment. Nasal washings and  aspirates are unacceptable for Xpert Xpress SARS-CoV-2/FLU/RSV  testing.  Fact Sheet for  Patients: [place]  Fact Sheet for Healthcare Providers: https://www.moore.com/  This test is not yet approved or cleared by the https://www.young.biz/ FDA and  has been authorized for detection and/or diagnosis of SARS-CoV-2 by  FDA under an Emergency Use Authorization (EUA). This EUA will  remain  in effect (meaning this test can be used) for the duration of the  Covid-19 declaration under Section 564(b)(1) of the Act, 21  U.S.C. section 360bbb-3(b)(1), unless the authorization is  terminated or revoked. Performed at [facility], [address] [place], [place] [address]   Blood Culture (routine x 2)     Status: None (Preliminary result)   Collection Time: 09/29/20  4:48 PM   Specimen: BLOOD  Result Value Ref Range Status   Specimen Description   Final    BLOOD BLOOD RIGHT FOREARM Performed at [facility], [address] [place], [place] [address]    Special Requests   Final    BOTTLES DRAWN AEROBIC AND ANAEROBIC Blood Culture adequate volume Performed at [facility], [address] [place], [place] [address]    Culture   Final    NO GROWTH 3 DAYS Performed at [facility], [address], [place], [place] [address]    Report Status PENDING  Incomplete     Time coordinating discharge: 45 minutes  SIGNED:   Coralie Keens, MD  [facility] 10/03/2020, 9:16 AM

## 2020-10-04 LAB — CULTURE, BLOOD (ROUTINE X 2)
Culture: NO GROWTH
Culture: NO GROWTH
Special Requests: ADEQUATE

## 2022-04-21 IMAGING — DX DG CHEST 1V PORT
1 series · 1 of 1 positions shown · non-contrast
Comparison: None.

CLINICAL DATA: Shortness of breath, suspect COVID.

EXAM:
PORTABLE CHEST 1 VIEW

[chest ap]
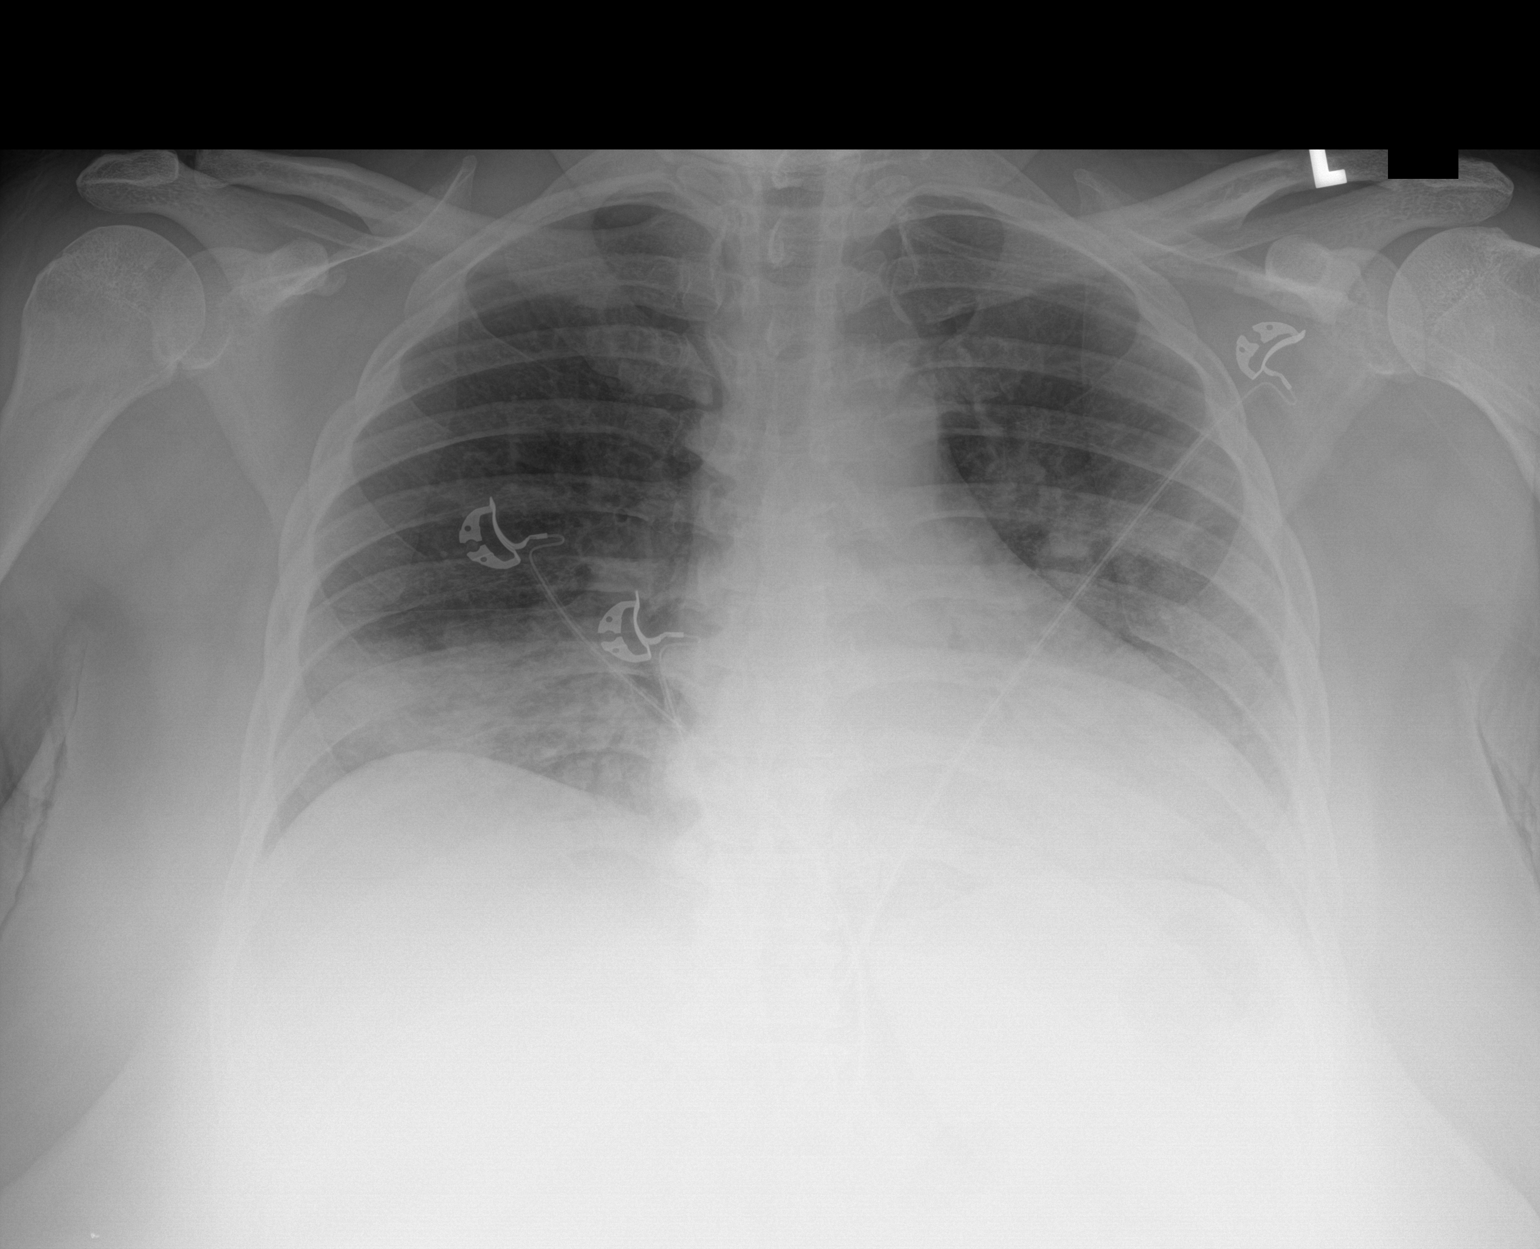

[1 of 1 positions shown; findings below may reference images not displayed]

FINDINGS: Cardiac silhouette is accentuated by low lung volumes and portable
technique. Patchy airspace opacities in bilateral lung bases and the
left midlung. No visible pleural effusions or pneumothorax.
Borderline enlargement of cardiac silhouette which is accentuated by
low lung volumes and portable technique. The visualized skeletal
structures are unremarkable.
IMPRESSION: Patchy airspace opacities in bilateral lung bases and the left
midlung, compatible with multifocal pneumonia. Correlate with COVID
testing.
